# Patient Record
Sex: Male | Born: 1980 | Race: Black or African American | Hispanic: No | Marital: Married | Smoking: Former smoker
Health system: Southern US, Community
[De-identification: ages and names within clinical notes are randomized; demographics above are authoritative.]

## PROBLEM LIST (undated history)

## (undated) DIAGNOSIS — K219 Gastro-esophageal reflux disease without esophagitis: Secondary | ICD-10-CM

## (undated) DIAGNOSIS — I499 Cardiac arrhythmia, unspecified: Secondary | ICD-10-CM

## (undated) DIAGNOSIS — J449 Chronic obstructive pulmonary disease, unspecified: Secondary | ICD-10-CM

## (undated) DIAGNOSIS — I4891 Unspecified atrial fibrillation: Secondary | ICD-10-CM

## (undated) HISTORY — PX: ORIF FINGER / THUMB FRACTURE: SUR932

## (undated) HISTORY — DX: Gastro-esophageal reflux disease without esophagitis: K21.9

## (undated) HISTORY — DX: Unspecified atrial fibrillation: I48.91

---

## 2016-02-17 ENCOUNTER — Emergency Department: Payer: Self-pay

## 2016-02-17 ENCOUNTER — Encounter: Payer: Self-pay | Admitting: *Deleted

## 2016-02-17 ENCOUNTER — Emergency Department
Admission: EM | Admit: 2016-02-17 | Discharge: 2016-02-17 | Disposition: A | Discharge status: Home / Self Care | Payer: Self-pay | Attending: Emergency Medicine | Admitting: Emergency Medicine

## 2016-02-17 DIAGNOSIS — R1031 Right lower quadrant pain: Secondary | ICD-10-CM | POA: Insufficient documentation

## 2016-02-17 DIAGNOSIS — F1290 Cannabis use, unspecified, uncomplicated: Secondary | ICD-10-CM | POA: Insufficient documentation

## 2016-02-17 DIAGNOSIS — F17200 Nicotine dependence, unspecified, uncomplicated: Secondary | ICD-10-CM | POA: Insufficient documentation

## 2016-02-17 DIAGNOSIS — R109 Unspecified abdominal pain: Secondary | ICD-10-CM

## 2016-02-17 LAB — COMPREHENSIVE METABOLIC PANEL
ALK PHOS: 50 U/L (ref 38–126)
ALT: 23 U/L (ref 17–63)
AST: 18 U/L (ref 15–41)
Albumin: 4.1 g/dL (ref 3.5–5.0)
Anion gap: 4 — ABNORMAL LOW (ref 5–15)
BUN: 14 mg/dL (ref 6–20)
CALCIUM: 8.7 mg/dL — AB (ref 8.9–10.3)
CHLORIDE: 107 mmol/L (ref 101–111)
CO2: 28 mmol/L (ref 22–32)
CREATININE: 1.04 mg/dL (ref 0.61–1.24)
Glucose, Bld: 97 mg/dL (ref 65–99)
Potassium: 3.9 mmol/L (ref 3.5–5.1)
Sodium: 139 mmol/L (ref 135–145)
Total Bilirubin: 0.6 mg/dL (ref 0.3–1.2)
Total Protein: 7.2 g/dL (ref 6.5–8.1)

## 2016-02-17 LAB — URINALYSIS COMPLETE WITH MICROSCOPIC (ARMC ONLY)
Bacteria, UA: NONE SEEN
Bilirubin Urine: NEGATIVE
Glucose, UA: NEGATIVE mg/dL
KETONES UR: NEGATIVE mg/dL
LEUKOCYTES UA: NEGATIVE
Nitrite: NEGATIVE
PH: 5 (ref 5.0–8.0)
PROTEIN: NEGATIVE mg/dL
SPECIFIC GRAVITY, URINE: 1.023 (ref 1.005–1.030)

## 2016-02-17 LAB — CBC
HCT: 44.6 % (ref 40.0–52.0)
Hemoglobin: 15.5 g/dL (ref 13.0–18.0)
MCH: 32.1 pg (ref 26.0–34.0)
MCHC: 34.8 g/dL (ref 32.0–36.0)
MCV: 92.4 fL (ref 80.0–100.0)
PLATELETS: 187 10*3/uL (ref 150–440)
RBC: 4.83 MIL/uL (ref 4.40–5.90)
RDW: 13.2 % (ref 11.5–14.5)
WBC: 6.9 10*3/uL (ref 3.8–10.6)

## 2016-02-17 LAB — LIPASE, BLOOD: LIPASE: 26 U/L (ref 11–51)

## 2016-02-17 MED ORDER — ONDANSETRON HCL 4 MG/2ML IJ SOLN
4.0000 mg | Freq: Once | INTRAMUSCULAR | Status: AC
  Administered 2016-02-17: 4 mg via INTRAVENOUS
  Filled 2016-02-17: qty 2

## 2016-02-17 MED ORDER — SODIUM CHLORIDE 0.9 % IV BOLUS (SEPSIS)
1000.0000 mL | Freq: Once | INTRAVENOUS | Status: AC
  Administered 2016-02-17: 1000 mL via INTRAVENOUS

## 2016-02-17 MED ORDER — KETOROLAC TROMETHAMINE 30 MG/ML IJ SOLN
30.0000 mg | Freq: Once | INTRAMUSCULAR | Status: AC
  Administered 2016-02-17: 30 mg via INTRAVENOUS
  Filled 2016-02-17: qty 1

## 2016-02-17 MED ORDER — MORPHINE SULFATE (PF) 4 MG/ML IV SOLN
4.0000 mg | Freq: Once | INTRAVENOUS | Status: AC
  Administered 2016-02-17: 4 mg via INTRAVENOUS
  Filled 2016-02-17: qty 1

## 2016-02-17 NOTE — ED Triage Notes (Signed)
Pt presents w/ c/o RLQ abdominal pain that started yesterday. Pt states 1 yr ago he had similar pain for which he was not treated and it resolved at that time. Pt denies urinary sxs, c/o diarrhea. Pt states loss of appetite on Wed but was able to eat today. Pt denies n/v at this time, c/o diarrhea.

## 2016-02-17 NOTE — ED Provider Notes (Signed)
[facility] Emergency Department Provider Note  ____________________________________________  Time seen: Approximately 4:05 AM  I have reviewed the triage vital signs and the nursing notes.   HISTORY  Chief Complaint Abdominal Pain   HPI Daniel Ruiz is a 35 y.o. male no significant past medical history who presents for evaluation of right flank pain. Patient reports that the pain started at 11 PM this evening. The pain is sharp, 8 out of 10, located in his right flank, radiated to his right groin, constant. He hasn't tried anything for the pain. He denies dysuria or hematuria, nausea or vomiting, fever, chest pain, shortness of breath. Patient reports that he thinks he had a prior kidney stone but is not sure. No prior abdominal surgeries.  History reviewed. No pertinent past medical history.  There are no active problems to display for this patient.   Past Surgical History:  Procedure Laterality Date  . ORIF FINGER / THUMB FRACTURE Right     Prior to Admission medications   Not on File    Allergies Review of patient's allergies indicates no known allergies.  History reviewed. No pertinent family history.  Social History Social History  Substance Use Topics  . Smoking status: Current Every Day Smoker  . Smokeless tobacco: Never Used  . Alcohol use Yes    Review of Systems  Constitutional: Negative for fever. Eyes: Negative for visual changes. ENT: Negative for sore throat. Cardiovascular: Negative for chest pain. Respiratory: Negative for shortness of breath. Gastrointestinal: + RLQ abdominal pain. No vomiting or diarrhea. Genitourinary: Negative for dysuria. + R flank pain Musculoskeletal: Negative for back pain. Skin: Negative for rash. Neurological: Negative for headaches, weakness or numbness.  ____________________________________________   PHYSICAL EXAM:  VITAL SIGNS: ED Triage Vitals  Enc Vitals Group     BP  02/17/16 0256 (!) 142/87     Pulse Rate 02/17/16 0256 82     Resp 02/17/16 0256 18     Temp 02/17/16 0256 98.2 F (36.8 C)     Temp Source 02/17/16 0256 Oral     SpO2 02/17/16 0256 99 %     Weight 02/17/16 0257 220 lb (99.8 kg)     Height 02/17/16 0257  (1.905 m)     Head Circumference --      Peak Flow --      Pain Score 02/17/16 0257 2     Pain Loc --      Pain Edu? --      Excl. in GC? --     Constitutional: Alert and oriented. Well appearing and in no apparent distress. HEENT:      Head: Normocephalic and atraumatic.         Eyes: Conjunctivae are normal. Sclera is non-icteric. EOMI. PERRL      Mouth/Throat: Mucous membranes are moist.       Neck: Supple with no signs of meningismus. Cardiovascular: Regular rate and rhythm. No murmurs, gallops, or rubs. 2+ symmetrical distal pulses are present in all extremities. No JVD. Respiratory: Normal respiratory effort. Lungs are clear to auscultation bilaterally. No wheezes, crackles, or rhonchi.  Gastrointestinal: Soft, mild ttp over the RLQ, and non distended with positive bowel sounds. No rebound or guarding. Genitourinary: No CVA tenderness. Bilateral descended testes, no erythema or swelling of his scrotum, no evidence of inguinal hernia, positive cremasteric reflex Musculoskeletal: Nontender with normal range of motion in all extremities. No edema, cyanosis, or erythema of extremities. Neurologic: Normal speech and language. Face is  symmetric. Moving all extremities. No gross focal neurologic deficits are appreciated. Skin: Skin is warm, dry and intact. No rash noted. Psychiatric: Mood and affect are normal. Speech and behavior are normal.  ____________________________________________   LABS (all labs ordered are listed, but only abnormal results are displayed)  Labs Reviewed  COMPREHENSIVE METABOLIC PANEL - Abnormal; Notable for the following:       Result Value   Calcium 8.7 (*)    Anion gap 4 (*)    All other  components within normal limits  URINALYSIS COMPLETEWITH MICROSCOPIC ([facility] ONLY) - Abnormal; Notable for the following:    Color, Urine YELLOW (*)    APPearance CLEAR (*)    Hgb urine dipstick 1+ (*)    Squamous Epithelial / LPF 0-5 (*)    All other components within normal limits  LIPASE, BLOOD  CBC   ____________________________________________  EKG   None ____________________________________________  RADIOLOGY  CT renal:  Negative ____________________________________________   PROCEDURES  Procedure(s) performed: None Procedures Critical Care performed:  None ____________________________________________   INITIAL IMPRESSION / ASSESSMENT AND PLAN / ED COURSE   35 y.o. male no significant past medical history who presents for evaluation of [id number] onset right flank pain radiating to the RLQ. On exam as well appearing, no distress, vital signs within normal limits, he has mild tenderness to palpation in the right lower quadrant, GU exams within normal limits, no flank tenderness. Labs showing normal white count, normal kidney function, UA with 1+ blood but no evidence of infection. Presentation concerning for kidney stone. We'll treat symptoms with IV fluids, IV Toradol, IV morphine, IV Zofran. We'll get a CT renal protocol.  Clinical Course  Comment By Time  CT showing normal appendix and no evidence of kidney stone. Patient is pain-free and reports that he feels very hungry him like to go home. We'll discharge him home with [facility].   I discussed my evaluation of the patient's symptoms, my clinical impression, and my proposed outpatient treatment plan with patient/ family members. We have discussed anticipatory guidance, scheduled follow-up, and careful return precautions. The patient expresses understanding and is comfortable with the discharge plan. All patient's questions were answered.  Nita Sicklearolina Abdulkarim Eberlin, MD 08/11 [phone]    Pertinent  labs & imaging results that were available during my care of the patient were reviewed by me and considered in my medical decision making (see chart for details).    ____________________________________________   FINAL CLINICAL IMPRESSION(S) / ED DIAGNOSES  Final diagnoses:  Abdominal pain, unspecified abdominal location      NEW MEDICATIONS STARTED DURING THIS VISIT:  New Prescriptions   No medications on file     Note:  This document was prepared using Dragon voice recognition software and may include unintentional dictation errors.    Nita Sicklearolina Kees Idrovo, MD 02/17/16 [phone]

## 2016-02-17 NOTE — ED Notes (Signed)
Pt reports right side pain since this afternoon - pt reports he has had this pain before and passed a kidney stone - denies pain with urination - denies frequency with urination

## 2016-02-17 NOTE — Discharge Instructions (Signed)

## 2016-03-09 ENCOUNTER — Emergency Department
Admission: EM | Admit: 2016-03-09 | Discharge: 2016-03-09 | Disposition: A | Discharge status: Home / Self Care | Payer: Self-pay | Attending: Emergency Medicine | Admitting: Emergency Medicine

## 2016-03-09 DIAGNOSIS — R197 Diarrhea, unspecified: Secondary | ICD-10-CM | POA: Insufficient documentation

## 2016-03-09 DIAGNOSIS — F17210 Nicotine dependence, cigarettes, uncomplicated: Secondary | ICD-10-CM | POA: Insufficient documentation

## 2016-03-09 DIAGNOSIS — J069 Acute upper respiratory infection, unspecified: Secondary | ICD-10-CM | POA: Insufficient documentation

## 2016-03-09 MED ORDER — PROMETHAZINE-DM 6.25-15 MG/5ML PO SYRP: ORAL_SOLUTION | ORAL | 0 refills | Status: DC

## 2016-03-09 NOTE — ED Triage Notes (Signed)
Pt c/o cough with sinus and chest congestion for the past 2 days.

## 2016-03-09 NOTE — Discharge Instructions (Signed)
Discontinue smoking. Increase fluids. Tylenol or ibuprofen as needed for throat pain or fever or aches. Phenergan DM as needed for cough and congestion. Follow-up with your primary care doctor or [facility] if any continued problems.

## 2016-03-09 NOTE — ED Provider Notes (Signed)
[facility] Emergency Department Provider Note   ____________________________________________   First MD Initiated Contact with Patient 03/09/16 [phone]     (approximate)  I have reviewed the triage vital signs and the nursing notes.   HISTORY  Chief Complaint URI   HPI Daniel Ruiz is a 35 y.o. male here with complaint of nonproductive cough and chest congestion for 2 days. Patient states that once during the today. He took a over-the-counter sinus medicine that belonged to a friend one time. Patient is unaware of any fever or chills but states that he woke up sweating this morning but had "covers" on top of him. He has not taken any other medication for this. He denies any throat pain, ear pain, nausea or vomiting. Patient states he has had minimal diarrhea which he adds is not unusual for him. Patient continues to smoke one half pack of cigarettes per day. He also admits to smoking marijuana 3 times a week. Currently he rates his discomfort as 6 out of 10.   History reviewed. No pertinent past medical history.  There are no active problems to display for this patient.   Past Surgical History:  Procedure Laterality Date  . ORIF FINGER / THUMB FRACTURE Right     Prior to Admission medications   Medication Sig Start Date End Date Taking? Authorizing Provider  promethazine-dextromethorphan (PROMETHAZINE-DM) 6.25-15 MG/5ML syrup 1 tsp qid for cough and congestion 03/09/16   Tommi Rumps, PA-C    Allergies Review of patient's allergies indicates no known allergies.  No family history on file.  Social History Social History  Substance Use Topics  . Smoking status: Current Every Day Smoker  . Smokeless tobacco: Never Used  . Alcohol use Yes    Review of Systems Constitutional: No fever/chills Eyes: No visual changes. ENT: No sore throat.Denies ear pain. Cardiovascular: Denies chest pain. Respiratory: Denies shortness of breath.  Nonproductive congested cough. Gastrointestinal: No abdominal pain.  No nausea, no vomiting.  Occasional diarrhea.  No constipation. Genitourinary: Negative for dysuria. Musculoskeletal: Negative for back pain. Skin: Negative for rash. Neurological: Negative for headaches, focal weakness or numbness.  10-point ROS otherwise negative.  ____________________________________________   PHYSICAL EXAM:  VITAL SIGNS: ED Triage Vitals  Enc Vitals Group     BP 03/09/16 0803 (!) 129/92     Pulse Rate 03/09/16 0803 (!) 103     Resp 03/09/16 0803 18     Temp 03/09/16 0803 98 F (36.7 C)     Temp Source 03/09/16 0803 Oral     SpO2 --      Weight 03/09/16 0759 220 lb (99.8 kg)     Height 03/09/16 0759 6\' 3"  (1.905 m)     Head Circumference --      Peak Flow --      Pain Score 03/09/16 0800 6     Pain Loc --      Pain Edu? --      Excl. in GC? --     Constitutional: Alert and oriented. Well appearing and in no acute distress. Eyes: Conjunctivae are normal. PERRL. EOMI. Head: Atraumatic. Nose: Minimal congestion/rhinnorhea.  EACs are clear bilaterally. TMs are dull bilaterally. No erythema or injection seen. Mouth/Throat: Mucous membranes are moist.  Oropharynx non-erythematous. Neck: No stridor.   Hematological/Lymphatic/Immunilogical: No cervical lymphadenopathy. Cardiovascular: Normal rate, regular rhythm. Grossly normal heart sounds.  Good peripheral circulation. Respiratory: Normal respiratory effort.  No retractions. Lungs CTAB. Rare cough present Musculoskeletal: No lower extremity tenderness  nor edema.  No joint effusions. Neurologic:  Normal speech and language. No gross focal neurologic deficits are appreciated. No gait instability. Skin:  Skin is warm, dry and intact. No rash noted. Psychiatric: Mood and affect are normal. Speech and behavior are normal.  ____________________________________________   LABS (all labs ordered are listed, but only abnormal results are  displayed)  Labs Reviewed - No data to display  PROCEDURES  Procedure(s) performed: None  Procedures  Critical Care performed: No  ____________________________________________   INITIAL IMPRESSION / ASSESSMENT AND PLAN / ED COURSE  Pertinent labs & imaging results that were available during my care of the patient were reviewed by me and considered in my medical decision making (see chart for details).    Clinical Course   Patient was given a prescription for Phenergan DM for cough and congestion. Patient was encouraged to discontinue smoking. He is to increase fluids and also take Tylenol or ibuprofen if needed for fever or aches.  ____________________________________________   FINAL CLINICAL IMPRESSION(S) / ED DIAGNOSES  Final diagnoses:  URI (upper respiratory infection)      NEW MEDICATIONS STARTED DURING THIS VISIT:  Discharge Medication List as of 03/09/2016  8:52 AM    START taking these medications   Details  promethazine-dextromethorphan (PROMETHAZINE-DM) 6.25-15 MG/5ML syrup 1 tsp qid for cough and congestion, Print         Note:  This document was prepared using Dragon voice recognition software and may include unintentional dictation errors.    Tommi Rumpshonda L Terrall Bley, PA-C 03/09/16 1023    Emily FilbertJonathan E Williams, MD 03/09/16 1124

## 2016-03-21 ENCOUNTER — Emergency Department
Admission: EM | Admit: 2016-03-21 | Discharge: 2016-03-21 | Disposition: A | Discharge status: Home / Self Care | Payer: Self-pay | Attending: Emergency Medicine | Admitting: Emergency Medicine

## 2016-03-21 ENCOUNTER — Encounter: Payer: Self-pay | Admitting: Emergency Medicine

## 2016-03-21 ENCOUNTER — Emergency Department: Payer: Self-pay

## 2016-03-21 DIAGNOSIS — S46912A Strain of unspecified muscle, fascia and tendon at shoulder and upper arm level, left arm, initial encounter: Secondary | ICD-10-CM | POA: Insufficient documentation

## 2016-03-21 DIAGNOSIS — F17200 Nicotine dependence, unspecified, uncomplicated: Secondary | ICD-10-CM | POA: Insufficient documentation

## 2016-03-21 DIAGNOSIS — X58XXXA Exposure to other specified factors, initial encounter: Secondary | ICD-10-CM | POA: Insufficient documentation

## 2016-03-21 DIAGNOSIS — M25512 Pain in left shoulder: Secondary | ICD-10-CM

## 2016-03-21 DIAGNOSIS — Y999 Unspecified external cause status: Secondary | ICD-10-CM | POA: Insufficient documentation

## 2016-03-21 DIAGNOSIS — Y939 Activity, unspecified: Secondary | ICD-10-CM | POA: Insufficient documentation

## 2016-03-21 DIAGNOSIS — Y929 Unspecified place or not applicable: Secondary | ICD-10-CM | POA: Insufficient documentation

## 2016-03-21 MED ORDER — METHOCARBAMOL 750 MG PO TABS: 750.0000 mg | ORAL_TABLET | Freq: Four times a day (QID) | ORAL | 0 refills | Status: DC

## 2016-03-21 MED ORDER — NAPROXEN 500 MG PO TABS: 500.0000 mg | ORAL_TABLET | Freq: Two times a day (BID) | ORAL | 0 refills | Status: DC

## 2016-03-21 MED ORDER — PREDNISONE 10 MG PO TABS: 50.0000 mg | ORAL_TABLET | Freq: Every day | ORAL | 0 refills | Status: DC

## 2016-03-21 NOTE — ED Provider Notes (Signed)
[facility] Emergency Department Provider Note  ____________________________________________  Time seen: Approximately 8:53 AM  I have reviewed the triage vital signs and the nursing notes.   HISTORY  Chief Complaint Shoulder Pain and work note    HPI Daniel Ruiz is a 35 y.o. male who presents for evaluation of left shoulder pain for 1 month. Patient reports with range of motion hurts worse when raising his arm over his head. Denies any known trauma. Patient states his pain is 7/10 nonradiating.  History reviewed. No pertinent past medical history.  There are no active problems to display for this patient.   Past Surgical History:  Procedure Laterality Date  . ORIF FINGER / THUMB FRACTURE Right     Prior to Admission medications   Medication Sig Start Date End Date Taking? Authorizing Provider  methocarbamol (ROBAXIN) 750 MG tablet Take 1 tablet (750 mg total) by mouth 4 (four) times daily. 03/21/16   Evangeline Dakin, PA-C  naproxen (NAPROSYN) 500 MG tablet Take 1 tablet (500 mg total) by mouth 2 (two) times daily with a meal. 03/21/16   Evangeline Dakin, PA-C  predniSONE (DELTASONE) 10 MG tablet Take 5 tablets (50 mg total) by mouth daily with breakfast. 03/21/16   Evangeline Dakin, PA-C    Allergies Review of patient's allergies indicates no known allergies.  No family history on file.  Social History Social History  Substance Use Topics  . Smoking status: Current Every Day Smoker  . Smokeless tobacco: Never Used  . Alcohol use Yes    Review of Systems Constitutional: No fever/chills Eyes: No visual changes. ENT: No sore throat. Cardiovascular: Denies chest pain. Respiratory: Denies shortness of breath. Gastrointestinal: No abdominal pain.  No nausea, no vomiting.  No diarrhea.  No constipation. Genitourinary: Negative for dysuria. Musculoskeletal: Negative for back pain. Skin: Negative for rash. Neurological: Negative for headaches,  focal weakness or numbness.  10-point ROS otherwise negative.  ____________________________________________   PHYSICAL EXAM:  VITAL SIGNS: ED Triage Vitals  Enc Vitals Group     BP 03/21/16 0849 (!) 152/71     Pulse Rate 03/21/16 0849 75     Resp 03/21/16 0849 17     Temp 03/21/16 0849 97.7 F (36.5 C)     Temp Source 03/21/16 0849 Oral     SpO2 03/21/16 0849 98 %     Weight 03/21/16 0850 220 lb (99.8 kg)     Height 03/21/16 0850 6\' 3"  (1.905 m)     Head Circumference --      Peak Flow --      Pain Score 03/21/16 0849 7     Pain Loc --      Pain Edu? --      Excl. in GC? --     Constitutional: Alert and oriented. Well appearing and in no acute distress. Neck: No stridor. Supple, full range of motion.  Cardiovascular: Normal rate, regular rhythm. Grossly normal heart sounds.  Good peripheral circulation. Respiratory: Normal respiratory effort.  No retractions. Lungs CTAB. Musculoskeletal: Left shoulder with limited range of motion increased pain with extension and abduction and adduction. Neurologic:  Normal speech and language. No gross focal neurologic deficits are appreciated. No gait instability. Skin:  Skin is warm, dry and intact. No rash noted. Psychiatric: Mood and affect are normal. Speech and behavior are normal.  ____________________________________________   LABS (all labs ordered are listed, but only abnormal results are displayed)  Labs Reviewed - No data to display ____________________________________________  EKG   ____________________________________________  RADIOLOGY  No acute osseous findings noted. ____________________________________________   PROCEDURES  Procedure(s) performed: None  Critical Care performed: No  ____________________________________________   INITIAL IMPRESSION / ASSESSMENT AND PLAN / ED COURSE  Pertinent labs & imaging results that were available during my care of the patient were reviewed by me and considered  in my medical decision making (see chart for details). Review of the [place] CSRS was performed in accordance of the [facility] prior to dispensing any controlled drugs.  Acute left shoulder strain. Rx given for prednisone, Naprosyn and Robaxin. Patient placed in a sling immobilizer for comfort. Patient follow-up with orthopedics with PCP as needed.  Clinical Course    ____________________________________________   FINAL CLINICAL IMPRESSION(S) / ED DIAGNOSES  Final diagnoses:  Shoulder pain, acute, left  Shoulder strain, left, initial encounter     This chart was dictated using voice recognition software/Dragon. Despite best efforts to proofread, errors can occur which can change the meaning. Any change was purely unintentional.    Evangeline Dakinharles M Andy Moye, PA-C 03/21/16 1156    Minna AntisKevin Paduchowski, MD 03/21/16 [phone]

## 2016-03-21 NOTE — Discharge Instructions (Signed)
Sling as needed for comfort

## 2016-03-21 NOTE — ED Notes (Signed)
Developed pain to left shoulder about 1 month ago  Now having some numbness to same arm  This started a few days ago  denies any injury   No deformity  Pain increases with movement

## 2016-03-21 NOTE — ED Triage Notes (Signed)
Pt presents with left shoulder pain for one mth. Pt reports LROM and hurts worse to raise arm over head.

## 2016-12-02 IMAGING — CR DG SHOULDER 2+V*L*
1 series · 3 of 3 positions shown · non-contrast
Comparison: None in PACs

CLINICAL DATA: Left shoulder pain began 1 month ago. Patient is now
experiencing some numbness in the left arm which began several days
ago. No known injury. Pain is made worse with movement.

EXAM:
LEFT SHOULDER - 2+ VIEW

[Series 1: dg shoulder left · 0.14mm/px · 3 of 3 slices shown]
[im 1/3]
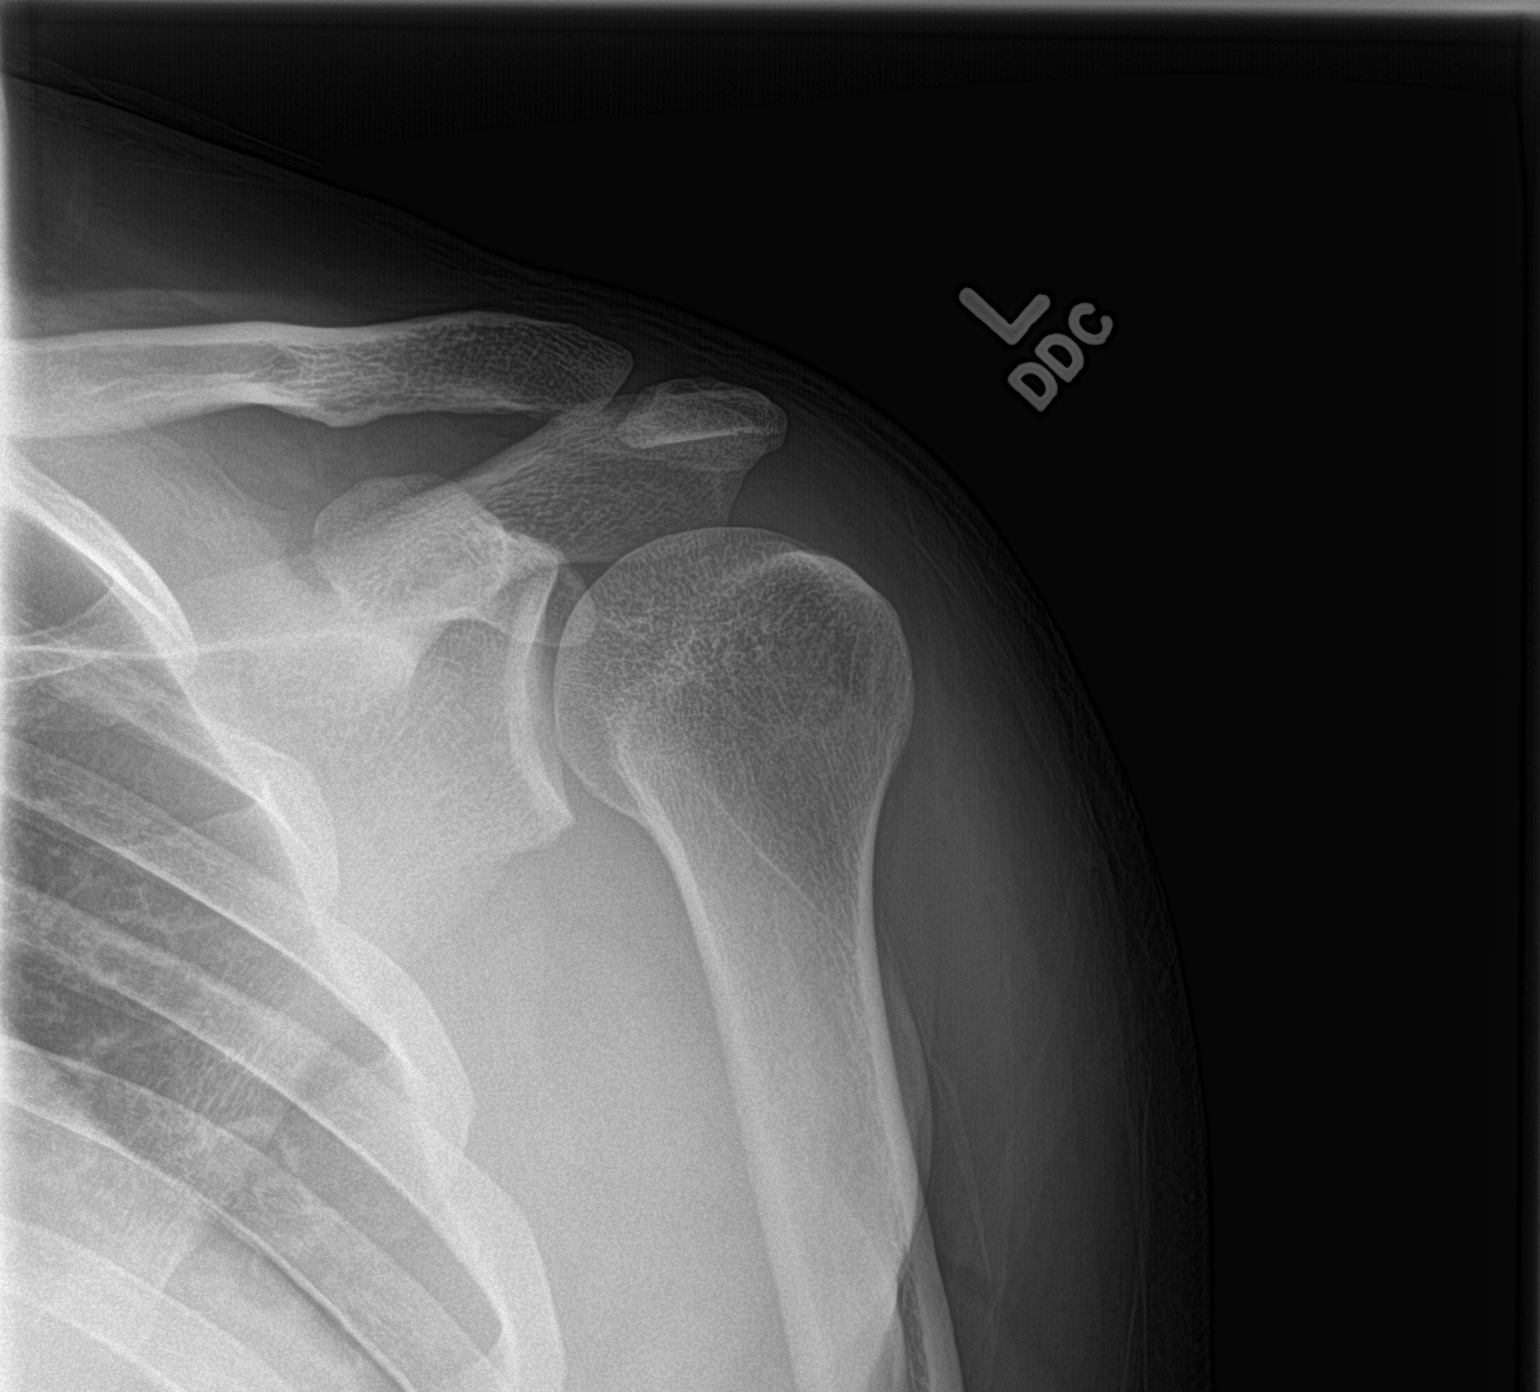
[im 2/3]
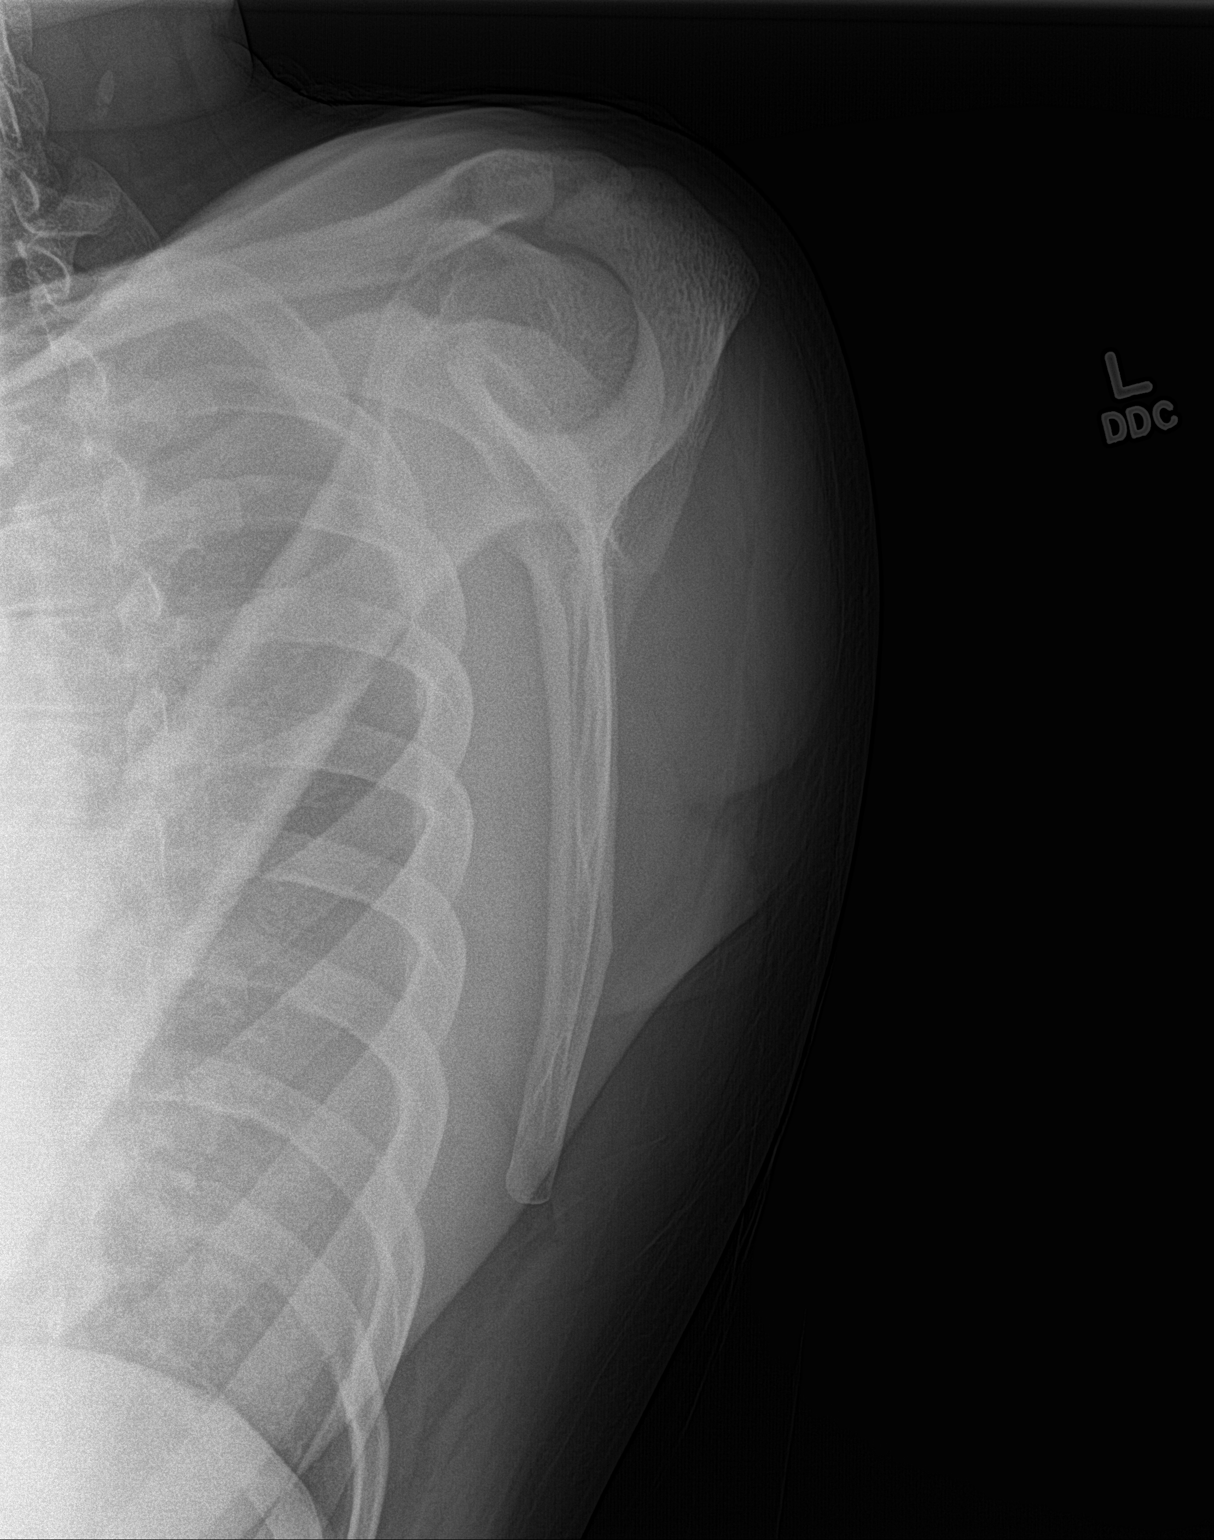
[im 3/3]
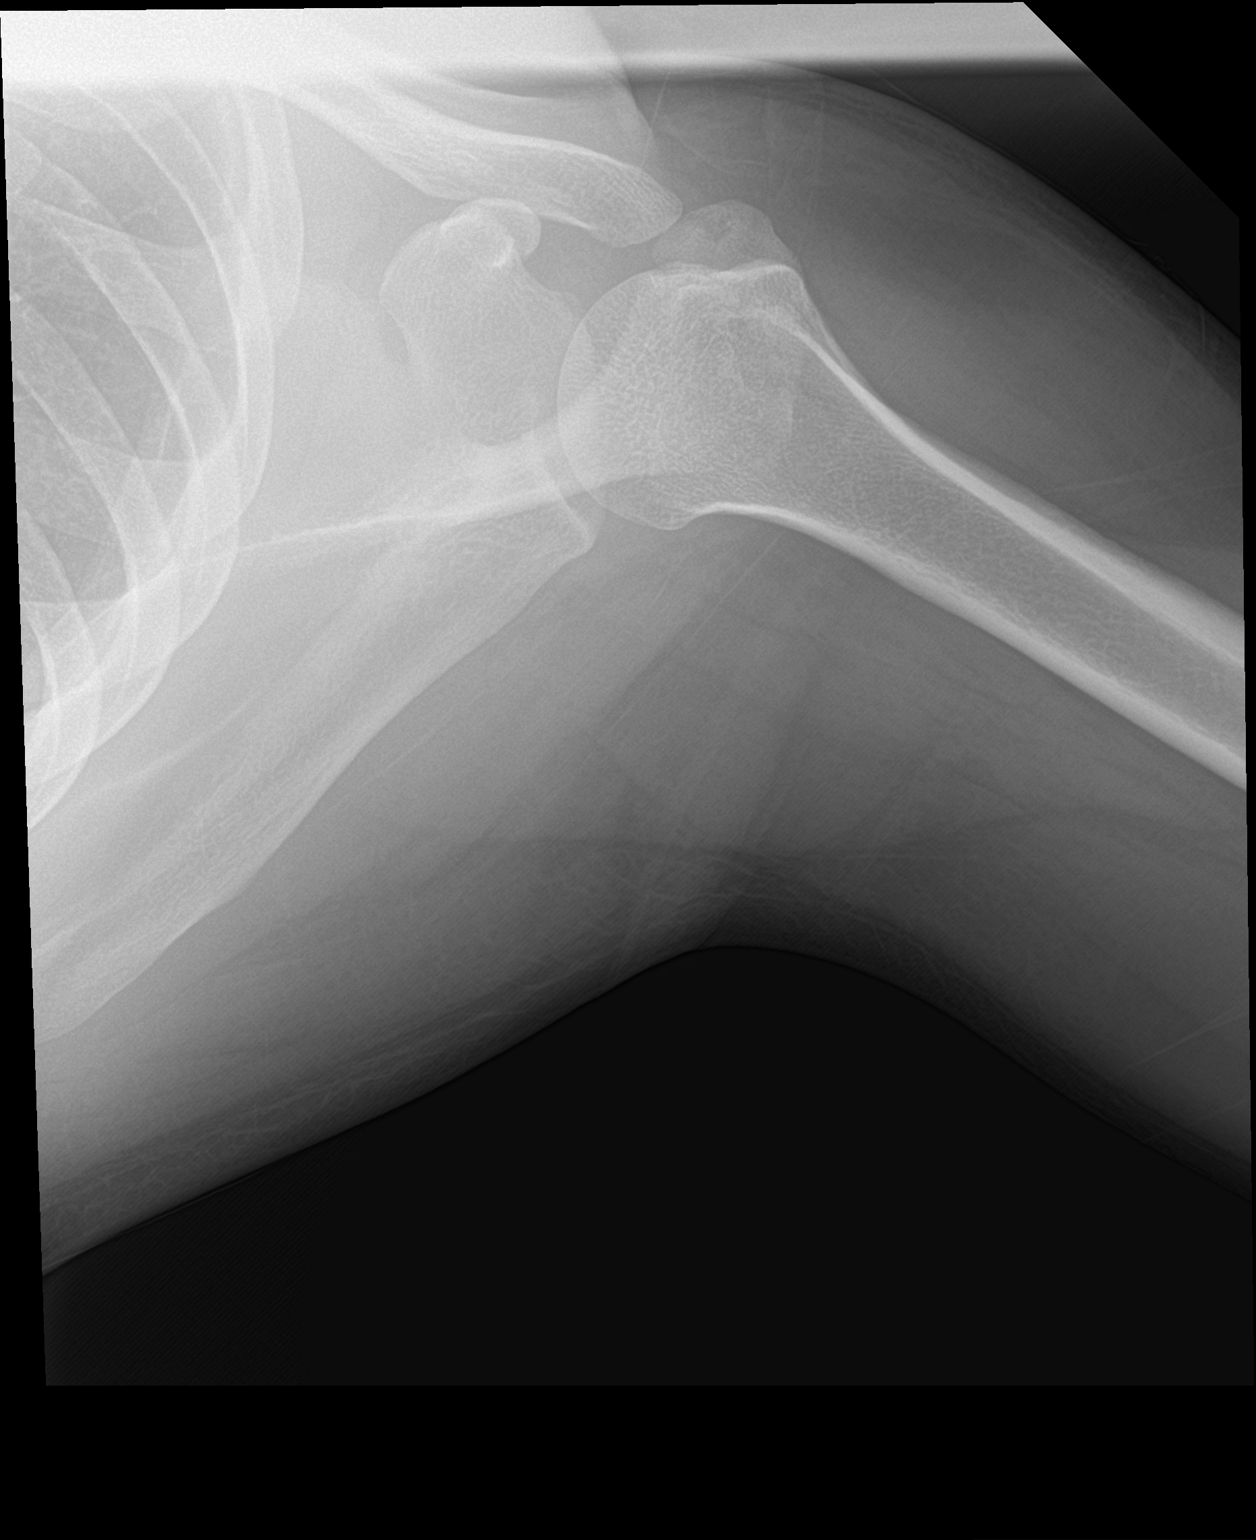

[3 of 3 positions shown; findings below may reference images not displayed]

FINDINGS: The bones are subjectively adequately mineralized. The glenohumeral
and AC joints are unremarkable. The observed portions of the left
clavicle and upper left ribs are normal.
IMPRESSION: There is no acute or significant chronic bony abnormality of the
left shoulder.

## 2017-01-07 ENCOUNTER — Encounter: Payer: Self-pay | Admitting: Emergency Medicine

## 2017-01-07 ENCOUNTER — Emergency Department
Admission: EM | Admit: 2017-01-07 | Discharge: 2017-01-07 | Disposition: A | Discharge status: Home / Self Care | Payer: Self-pay | Attending: Emergency Medicine | Admitting: Emergency Medicine

## 2017-01-07 DIAGNOSIS — K529 Noninfective gastroenteritis and colitis, unspecified: Secondary | ICD-10-CM

## 2017-01-07 DIAGNOSIS — F17200 Nicotine dependence, unspecified, uncomplicated: Secondary | ICD-10-CM | POA: Insufficient documentation

## 2017-01-07 DIAGNOSIS — K5289 Other specified noninfective gastroenteritis and colitis: Secondary | ICD-10-CM | POA: Insufficient documentation

## 2017-01-07 LAB — COMPREHENSIVE METABOLIC PANEL
ALBUMIN: 4 g/dL (ref 3.5–5.0)
ALK PHOS: 43 U/L (ref 38–126)
ALT: 36 U/L (ref 17–63)
AST: 29 U/L (ref 15–41)
Anion gap: 7 (ref 5–15)
BILIRUBIN TOTAL: 0.8 mg/dL (ref 0.3–1.2)
BUN: 15 mg/dL (ref 6–20)
CALCIUM: 8.9 mg/dL (ref 8.9–10.3)
CO2: 24 mmol/L (ref 22–32)
CREATININE: 1.14 mg/dL (ref 0.61–1.24)
Chloride: 108 mmol/L (ref 101–111)
GFR calc Af Amer: >60 mL/min (ref >60)
GFR calc non Af Amer: >60 mL/min (ref >60)
Glucose, Bld: 105 mg/dL — ABNORMAL HIGH (ref 65–99)
Potassium: 3.7 mmol/L (ref 3.5–5.1)
SODIUM: 139 mmol/L (ref 135–145)
Total Protein: 7.1 g/dL (ref 6.5–8.1)

## 2017-01-07 LAB — CBC
HCT: 46 % (ref 40.0–52.0)
Hemoglobin: 15.5 g/dL (ref 13.0–18.0)
MCH: 30.6 pg (ref 26.0–34.0)
MCHC: 33.8 g/dL (ref 32.0–36.0)
MCV: 90.7 fL (ref 80.0–100.0)
PLATELETS: 208 10*3/uL (ref 150–440)
RBC: 5.07 MIL/uL (ref 4.40–5.90)
RDW: 13.3 % (ref 11.5–14.5)
WBC: 5.3 10*3/uL (ref 3.8–10.6)

## 2017-01-07 LAB — LIPASE, BLOOD: Lipase: 31 U/L (ref 11–51)

## 2017-01-07 MED ORDER — ONDANSETRON 4 MG PO TBDP: 4.0000 mg | ORAL_TABLET | Freq: Three times a day (TID) | ORAL | 0 refills | Status: DC | PRN

## 2017-01-07 MED ORDER — ONDANSETRON HCL 4 MG/2ML IJ SOLN
4.0000 mg | Freq: Once | INTRAMUSCULAR | Status: AC | PRN
  Administered 2017-01-07: 4 mg via INTRAVENOUS
  Filled 2017-01-07: qty 2

## 2017-01-07 NOTE — ED Provider Notes (Signed)
[facility] Emergency Department Provider Note       Time seen: ----------------------------------------- 2:13 PM on 01/07/2017 -----------------------------------------     I have reviewed the triage vital signs and the nursing notes.   HISTORY   Chief Complaint Emesis and Diarrhea    HPI Daniel Ruiz is a 36 y.o. male who presents to the ED for vomiting and diarrhea that started this morning was sharp severe abdominal pain. Prior to my evaluation he has received a liter of fluids and antiemetics and currently feels better. Patient reports he vomited around 12 times morning and had diarrhea around 5 times today. He has not had a history of this before, nothing made it better or worse earlier.   History reviewed. No pertinent past medical history.  There are no active problems to display for this patient.   Past Surgical History:  Procedure Laterality Date  . ORIF FINGER / THUMB FRACTURE Right     Allergies Patient has no known allergies.  Social History Social History  Substance Use Topics  . Smoking status: Current Every Day Smoker  . Smokeless tobacco: Never Used  . Alcohol use Yes    Review of Systems Constitutional: Negative for fever. Cardiovascular: Negative for chest pain. Respiratory: Negative for shortness of breath. Gastrointestinal: Positive for abdominal pain, vomiting and diarrhea Genitourinary: Negative for dysuria. Musculoskeletal: Negative for back pain. Skin: Negative for rash. Neurological: Negative for headaches, focal weakness or numbness.  All systems negative/normal/unremarkable except as stated in the HPI  ____________________________________________   PHYSICAL EXAM:  VITAL SIGNS: ED Triage Vitals [01/07/17 1108]  Enc Vitals Group     BP 138/85     Pulse Rate 66     Resp      Temp 97.9 F (36.6 C)     Temp Source Oral     SpO2      Weight 220 lb (99.8 kg)     Height 6\' 3"  (1.905 m)     Head  Circumference      Peak Flow      Pain Score 10     Pain Loc      Pain Edu?      Excl. in GC?    Constitutional: Alert and oriented. Well appearing and in no distress. Eyes: Conjunctivae are normal. Normal extraocular movements. ENT   Head: Normocephalic and atraumatic.   Nose: No congestion/rhinnorhea.   Mouth/Throat: Mucous membranes are moist.   Neck: No stridor. Cardiovascular: Normal rate, regular rhythm. No murmurs, rubs, or gallops. Respiratory: Normal respiratory effort without tachypnea nor retractions. Breath sounds are clear and equal bilaterally. No wheezes/rales/rhonchi. Gastrointestinal: Soft and nontender. Normal bowel sounds Musculoskeletal: Nontender with normal range of motion in extremities. No lower extremity tenderness nor edema. Neurologic:  Normal speech and language. No gross focal neurologic deficits are appreciated.  Skin:  Skin is warm, dry and intact. No rash noted. Psychiatric: Mood and affect are normal. Speech and behavior are normal.  ___________________________________________  ED COURSE:  Pertinent labs & imaging results that were available during my care of the patient were reviewed by me and considered in my medical decision making (see chart for details). Patient presents for symptoms of gastroenteritis, we will assess with labs as indicated   Procedures ____________________________________________   LABS (pertinent positives/negatives)  Labs Reviewed  COMPREHENSIVE METABOLIC PANEL - Abnormal; Notable for the following:       Result Value   Glucose, Bld 105 (*)    All other components within normal  limits  LIPASE, BLOOD  CBC  URINALYSIS, COMPLETE (UACMP) WITH MICROSCOPIC   ____________________________________________  FINAL ASSESSMENT AND PLAN  Gastroenteritis  Plan: Patient's labs were dictated above. Patient had presented for vomiting and diarrhea which is now improved. Have advised a gentle diet progression, I will  prescribe antiemetics and he is stable for outpatient follow-up.   Emily Filbert, MD   Note: This note was generated in part or whole with voice recognition software. Voice recognition is usually quite accurate but there are transcription errors that can and very often do occur. I apologize for any typographical errors that were not detected and corrected.     Emily Filbert, MD 01/07/17 1415

## 2017-01-07 NOTE — ED Triage Notes (Signed)
Pt to ed with c/o vomiting and diarrhea that started this am with sharp severe abd pain.  Pt states he has vomited x 12 this am, also reports diarrhea x 5 today.

## 2018-02-20 ENCOUNTER — Encounter: Payer: Self-pay | Admitting: Emergency Medicine

## 2018-02-20 ENCOUNTER — Emergency Department
Admission: EM | Admit: 2018-02-20 | Discharge: 2018-02-20 | Disposition: A | Discharge status: Home / Self Care | Payer: Self-pay | Attending: Emergency Medicine | Admitting: Emergency Medicine

## 2018-02-20 ENCOUNTER — Emergency Department: Payer: Self-pay

## 2018-02-20 ENCOUNTER — Other Ambulatory Visit: Payer: Self-pay

## 2018-02-20 DIAGNOSIS — R109 Unspecified abdominal pain: Secondary | ICD-10-CM

## 2018-02-20 DIAGNOSIS — Z87891 Personal history of nicotine dependence: Secondary | ICD-10-CM | POA: Insufficient documentation

## 2018-02-20 DIAGNOSIS — R1031 Right lower quadrant pain: Secondary | ICD-10-CM | POA: Insufficient documentation

## 2018-02-20 LAB — URINALYSIS, COMPLETE (UACMP) WITH MICROSCOPIC
BILIRUBIN URINE: NEGATIVE
Bacteria, UA: NONE SEEN
GLUCOSE, UA: NEGATIVE mg/dL
HGB URINE DIPSTICK: NEGATIVE
KETONES UR: NEGATIVE mg/dL
LEUKOCYTES UA: NEGATIVE
NITRITE: NEGATIVE
Protein, ur: NEGATIVE mg/dL
Specific Gravity, Urine: 1.016 (ref 1.005–1.030)
Squamous Epithelial / LPF: NONE SEEN (ref 0–5)
pH: 6 (ref 5.0–8.0)

## 2018-02-20 LAB — COMPREHENSIVE METABOLIC PANEL
ALK PHOS: 46 U/L (ref 38–126)
ALT: 44 U/L (ref 0–44)
ANION GAP: 5 (ref 5–15)
AST: 32 U/L (ref 15–41)
Albumin: 4.2 g/dL (ref 3.5–5.0)
BUN: 21 mg/dL — ABNORMAL HIGH (ref 6–20)
CALCIUM: 8.6 mg/dL — AB (ref 8.9–10.3)
CO2: 28 mmol/L (ref 22–32)
Chloride: 108 mmol/L (ref 98–111)
Creatinine, Ser: 1.15 mg/dL (ref 0.61–1.24)
GFR calc non Af Amer: >60 mL/min (ref >60)
Glucose, Bld: 100 mg/dL — ABNORMAL HIGH (ref 70–99)
Potassium: 3.9 mmol/L (ref 3.5–5.1)
SODIUM: 141 mmol/L (ref 135–145)
Total Bilirubin: 1.2 mg/dL (ref 0.3–1.2)
Total Protein: 7.4 g/dL (ref 6.5–8.1)

## 2018-02-20 LAB — CBC
HCT: 41.8 % (ref 40.0–52.0)
HEMOGLOBIN: 14.4 g/dL (ref 13.0–18.0)
MCH: 31.8 pg (ref 26.0–34.0)
MCHC: 34.6 g/dL (ref 32.0–36.0)
MCV: 92 fL (ref 80.0–100.0)
Platelets: 211 10*3/uL (ref 150–440)
RBC: 4.54 MIL/uL (ref 4.40–5.90)
RDW: 13.1 % (ref 11.5–14.5)
WBC: 5.8 10*3/uL (ref 3.8–10.6)

## 2018-02-20 LAB — LIPASE, BLOOD: LIPASE: 35 U/L (ref 11–51)

## 2018-02-20 MED ORDER — PREDNISONE 10 MG (21) PO TBPK: ORAL_TABLET | Freq: Every day | ORAL | 0 refills | Status: DC

## 2018-02-20 MED ORDER — DIAZEPAM 5 MG PO TABS: 5.0000 mg | ORAL_TABLET | Freq: Three times a day (TID) | ORAL | 0 refills | Status: DC | PRN

## 2018-02-20 MED ORDER — KETOROLAC TROMETHAMINE 60 MG/2ML IM SOLN
60.0000 mg | Freq: Once | INTRAMUSCULAR | Status: AC
  Administered 2018-02-20: 60 mg via INTRAMUSCULAR
  Filled 2018-02-20: qty 2

## 2018-02-20 MED ORDER — OXYCODONE-ACETAMINOPHEN 5-325 MG PO TABS: 2.0000 | ORAL_TABLET | Freq: Once | ORAL | Status: DC

## 2018-02-20 MED ORDER — IBUPROFEN 800 MG PO TABS: 800.0000 mg | ORAL_TABLET | Freq: Three times a day (TID) | ORAL | 0 refills | Status: DC | PRN

## 2018-02-20 NOTE — ED Provider Notes (Signed)
[facility] Emergency Department Provider Note       Time seen: ----------------------------------------- 7:47 AM on 02/20/2018 -----------------------------------------   I have reviewed the triage vital signs and the nursing notes.  HISTORY   Chief Complaint Flank Pain and Abdominal Pain    HPI Daniel Ruiz is a 37 y.o. male with no significant past medical history who presents to the ED for right flank pain that radiates into his right lower quadrant.  Patient states flank pain started on Tuesday, describes as a cramping pain.  This pain is progressively gotten worse and traveled to the abdomen more anteriorly.  He denies any nausea or vomiting, had one episode of diarrhea here.  He denies any fevers, he does do a lot of heavy lifting with his job.  History reviewed. No pertinent past medical history.  There are no active problems to display for this patient.   Past Surgical History:  Procedure Laterality Date  . ORIF FINGER / THUMB FRACTURE Right     Allergies Patient has no known allergies.  Social History Social History   Tobacco Use  . Smoking status: Former Games developermoker  . Smokeless tobacco: Never Used  Substance Use Topics  . Alcohol use: Yes  . Drug use: Yes    Frequency: 3.0 times per week    Types: Marijuana   Review of Systems Constitutional: Negative for fever. Cardiovascular: Negative for chest pain. Respiratory: Negative for shortness of breath. Gastrointestinal: Positive for abdominal pain, flank pain Musculoskeletal: Positive for back pain Skin: Negative for rash. Neurological: Negative for headaches, focal weakness or numbness.  All systems negative/normal/unremarkable except as stated in the HPI  ____________________________________________   PHYSICAL EXAM:  VITAL SIGNS: ED Triage Vitals  Enc Vitals Group     BP 02/20/18 0638 (!) 145/102     Pulse Rate 02/20/18 0638 85     Resp 02/20/18 0638 20     Temp --       Temp Source 02/20/18 0638 Oral     SpO2 02/20/18 0638 100 %     Weight 02/20/18 0639 238 lb 1.6 oz (108 kg)     Height 02/20/18 0639 6\' 3"  (1.905 m)     Head Circumference --      Peak Flow --      Pain Score 02/20/18 0638 7     Pain Loc --      Pain Edu? --      Excl. in GC? --    Constitutional: Alert and oriented. Well appearing and in no distress. Eyes: Conjunctivae are normal. Normal extraocular movements. ENT   Head: Normocephalic and atraumatic.   Nose: No congestion/rhinnorhea.   Mouth/Throat: Mucous membranes are moist.   Neck: No stridor. Cardiovascular: Normal rate, regular rhythm. No murmurs, rubs, or gallops. Respiratory: Normal respiratory effort without tachypnea nor retractions. Breath sounds are clear and equal bilaterally. No wheezes/rales/rhonchi. Gastrointestinal: Right flank tenderness, no rebound or guarding.  Normal bowel sounds. Musculoskeletal: Nontender with normal range of motion in extremities. No lower extremity tenderness nor edema. Neurologic:  Normal speech and language. No gross focal neurologic deficits are appreciated.  Skin:  Skin is warm, dry and intact. No rash noted. Psychiatric: Mood and affect are normal. Speech and behavior are normal.   ____________________________________________  ED COURSE:  As part of my medical decision making, I reviewed the following data within the electronic MEDICAL RECORD NUMBER History obtained from family if available, nursing notes, old chart and ekg, as well as notes from  prior ED visits. Patient presented for flank pain, we will assess with labs and imaging as indicated at this time.   Procedures ____________________________________________   LABS (pertinent positives/negatives)  Labs Reviewed  COMPREHENSIVE METABOLIC PANEL - Abnormal; Notable for the following components:      Result Value   Glucose, Bld 100 (*)    BUN 21 (*)    Calcium 8.6 (*)    All other components within normal  limits  URINALYSIS, COMPLETE (UACMP) WITH MICROSCOPIC - Abnormal; Notable for the following components:   Color, Urine STRAW (*)    APPearance CLEAR (*)    All other components within normal limits  LIPASE, BLOOD  CBC    RADIOLOGY Images were viewed by me  CT renal protocol IMPRESSION: 1. Urinary bladder wall is mildly thickened. Question a degree of cystitis. Correlation with urinalysis advised in this regard.  2. No renal or ureteral calculus on either side. No hydronephrosis.  3. No evident bowel obstruction. No abscess in the abdomen or pelvis. Appendix appears normal. ____________________________________________  DIFFERENTIAL DIAGNOSIS   Musculoskeletal pain, bulging disc, renal colic, UTI, pyelonephritis  FINAL ASSESSMENT AND PLAN  Flank pain   Plan: The patient had presented for right flank pain. Patient's labs not reveal any acute process. Patient's imaging was negative, this is likely musculoskeletal in origin.  We will try a dose of steroids given the radicular component, pain medicine.  I have advised heating pad, massage and stretching.   Ulice DashJohnathan E Williams, MD   Note: This note was generated in part or whole with voice recognition software. Voice recognition is usually quite accurate but there are transcription errors that can and very often do occur. I apologize for any typographical errors that were not detected and corrected.     Emily FilbertWilliams, Jonathan E, MD 02/20/18 1010

## 2018-02-20 NOTE — ED Notes (Signed)
Pt in CT at this time.

## 2018-02-20 NOTE — ED Triage Notes (Signed)
Patient to ER for c/o right flank pain that radiates to RLQ abd. Patient states flank pain started on Tuesday, has progressively gotten worse and traveled to abdomen. Patient denies any N/V/D. Denies any fevers. Denies any urinary symptoms.

## 2018-02-28 ENCOUNTER — Encounter (HOSPITAL_COMMUNITY): Payer: Self-pay | Admitting: *Deleted

## 2018-02-28 ENCOUNTER — Emergency Department (HOSPITAL_COMMUNITY)
Admission: EM | Admit: 2018-02-28 | Discharge: 2018-02-28 | Disposition: A | Discharge status: Home / Self Care | Payer: Self-pay | Attending: Emergency Medicine | Admitting: Emergency Medicine

## 2018-02-28 DIAGNOSIS — M546 Pain in thoracic spine: Secondary | ICD-10-CM | POA: Insufficient documentation

## 2018-02-28 DIAGNOSIS — Z87891 Personal history of nicotine dependence: Secondary | ICD-10-CM | POA: Insufficient documentation

## 2018-02-28 DIAGNOSIS — Z79899 Other long term (current) drug therapy: Secondary | ICD-10-CM | POA: Insufficient documentation

## 2018-02-28 MED ORDER — KETOROLAC TROMETHAMINE 60 MG/2ML IM SOLN
60.0000 mg | Freq: Once | INTRAMUSCULAR | Status: DC
  Filled 2018-02-28: qty 2

## 2018-02-28 MED ORDER — METHOCARBAMOL 500 MG PO TABS
500.0000 mg | ORAL_TABLET | Freq: Once | ORAL | Status: AC
  Administered 2018-02-28: 500 mg via ORAL
  Filled 2018-02-28: qty 1

## 2018-02-28 MED ORDER — ACETAMINOPHEN 325 MG PO TABS
650.0000 mg | ORAL_TABLET | Freq: Once | ORAL | Status: AC
  Administered 2018-02-28: 650 mg via ORAL
  Filled 2018-02-28: qty 2

## 2018-02-28 NOTE — ED Triage Notes (Signed)
Pt complains of mid back pain radiating to right hip since moving a bed last week.

## 2018-02-28 NOTE — Discharge Instructions (Addendum)
Continue the medicine prescribed last week

## 2018-02-28 NOTE — ED Provider Notes (Signed)
[facility]-EMERGENCY DEPT Provider Note   CSN: [id number] Arrival date & time: 02/28/18  1111     History   Chief Complaint Chief Complaint  Patient presents with  . Back Pain    HPI Daniel Ruiz is a 37 y.o. male.  HPI Patient reports ongoing thoracic back pain.  Recent seen in the emergency department diagnosed with musculoskeletal pain.  Reports improvement for several days but now has re-exacerbated his back pain.  No difficulty breathing.  No fevers or chills.  No unilateral arm or leg weakness.  Denies chest pain.  No abdominal pain.  Symptoms are mild to moderate in severity.  No other complaints at this time.  No new complaints.  No dysuria or urinary frequency   History reviewed. No pertinent past medical history.  There are no active problems to display for this patient.   Past Surgical History:  Procedure Laterality Date  . ORIF FINGER / THUMB FRACTURE Right         Home Medications    Prior to Admission medications   Medication Sig Start Date End Date Taking? Authorizing Provider  diazepam (VALIUM) 5 MG tablet Take 1 tablet (5 mg total) by mouth every 8 (eight) hours as needed for muscle spasms. 02/20/18   Emily Filbert, MD  ibuprofen (ADVIL,MOTRIN) 800 MG tablet Take 1 tablet (800 mg total) by mouth every 8 (eight) hours as needed. 02/20/18   Emily Filbert, MD  methocarbamol (ROBAXIN) 750 MG tablet Take 1 tablet (750 mg total) by mouth 4 (four) times daily. 03/21/16   Beers, Charmayne Sheer, PA-C  naproxen (NAPROSYN) 500 MG tablet Take 1 tablet (500 mg total) by mouth 2 (two) times daily with a meal. 03/21/16   Beers, Charmayne Sheer, PA-C  ondansetron (ZOFRAN ODT) 4 MG disintegrating tablet Take 1 tablet (4 mg total) by mouth every 8 (eight) hours as needed for nausea or vomiting. 01/07/17   Emily Filbert, MD  predniSONE (DELTASONE) 10 MG tablet Take 5 tablets (50 mg total) by mouth daily with breakfast. 03/21/16   Beers, Charmayne Sheer,  PA-C  predniSONE (STERAPRED UNI-PAK 21 TAB) 10 MG (21) TBPK tablet Take by mouth daily. Dispense steroid taper pack as directed 02/20/18   Emily Filbert, MD    Family History No family history on file.  Social History Social History   Tobacco Use  . Smoking status: Former Games developer  . Smokeless tobacco: Never Used  Substance Use Topics  . Alcohol use: Yes  . Drug use: Yes    Frequency: 3.0 times per week    Types: Marijuana     Allergies   Patient has no known allergies.   Review of Systems Review of Systems  All other systems reviewed and are negative.    Physical Exam Updated Vital Signs BP 131/90   Pulse 88   Temp (!) 97.5 F (36.4 C) (Oral)   Resp 18   SpO2 99%   Physical Exam  Constitutional: He is oriented to person, place, and time. He appears well-developed and well-nourished.  HENT:  Head: Normocephalic and atraumatic.  Eyes: EOM are normal.  Neck: Normal range of motion.  Cardiovascular: Normal rate, regular rhythm, normal heart sounds and intact distal pulses.  Pulmonary/Chest: Effort normal and breath sounds normal. No respiratory distress.  Abdominal: Soft. He exhibits no distension. There is no tenderness.  Musculoskeletal: Normal range of motion.  Mild parathoracic tenderness without significant spasm.  No thoracic or lumbar point tenderness.  Neurological: He is alert and oriented to person, place, and time.  Skin: Skin is warm and dry.  Psychiatric: He has a normal mood and affect. Judgment normal.  Nursing note and vitals reviewed.    ED Treatments / Results  Labs (all labs ordered are listed, but only abnormal results are displayed) Labs Reviewed - No data to display  EKG None  Radiology No results found.  Procedures Procedures (including critical care time)  Medications Ordered in ED Medications  methocarbamol (ROBAXIN) tablet 500 mg (500 mg Oral Given 02/28/18 1207)  acetaminophen (TYLENOL) tablet 650 mg (650 mg Oral  Given 02/28/18 1207)     Initial Impression / Assessment and Plan / ED Course  I have reviewed the triage vital signs and the nursing notes.  Pertinent labs & imaging results that were available during my care of the patient were reviewed by me and considered in my medical decision making (see chart for details).     Overall well-appearing.  Suspect musculoskeletal pain.  No indication for advanced imaging.  No weakness of his arms or legs.  I do not think he needs MRI imaging of his back.  Abdomen is benign.  Discharged home in good condition  Final Clinical Impressions(s) / ED Diagnoses   Final diagnoses:  Acute right-sided thoracic back pain    ED Discharge Orders    None       Azalia Bilisampos, Peggie Hornak, MD 02/28/18 [phone]

## 2018-04-14 ENCOUNTER — Emergency Department (HOSPITAL_COMMUNITY)
Admission: EM | Admit: 2018-04-14 | Discharge: 2018-04-14 | Disposition: A | Discharge status: Home / Self Care | Payer: Self-pay | Attending: Emergency Medicine | Admitting: Emergency Medicine

## 2018-04-14 ENCOUNTER — Encounter (HOSPITAL_COMMUNITY): Payer: Self-pay | Admitting: Emergency Medicine

## 2018-04-14 DIAGNOSIS — R197 Diarrhea, unspecified: Secondary | ICD-10-CM | POA: Insufficient documentation

## 2018-04-14 DIAGNOSIS — Z79899 Other long term (current) drug therapy: Secondary | ICD-10-CM | POA: Insufficient documentation

## 2018-04-14 DIAGNOSIS — R112 Nausea with vomiting, unspecified: Secondary | ICD-10-CM | POA: Insufficient documentation

## 2018-04-14 DIAGNOSIS — Z87891 Personal history of nicotine dependence: Secondary | ICD-10-CM | POA: Insufficient documentation

## 2018-04-14 LAB — CBC
HCT: 41.8 % (ref 39.0–52.0)
HEMOGLOBIN: 14.5 g/dL (ref 13.0–17.0)
MCH: 31.6 pg (ref 26.0–34.0)
MCHC: 34.7 g/dL (ref 30.0–36.0)
MCV: 91.1 fL (ref 78.0–100.0)
Platelets: 232 10*3/uL (ref 150–400)
RBC: 4.59 MIL/uL (ref 4.22–5.81)
RDW: 12.4 % (ref 11.5–15.5)
WBC: 5.4 10*3/uL (ref 4.0–10.5)

## 2018-04-14 LAB — URINALYSIS, ROUTINE W REFLEX MICROSCOPIC
BILIRUBIN URINE: NEGATIVE
Glucose, UA: NEGATIVE mg/dL
HGB URINE DIPSTICK: NEGATIVE
Ketones, ur: NEGATIVE mg/dL
Leukocytes, UA: NEGATIVE
Nitrite: NEGATIVE
PH: 5 (ref 5.0–8.0)
PROTEIN: NEGATIVE mg/dL
SPECIFIC GRAVITY, URINE: 1.018 (ref 1.005–1.030)

## 2018-04-14 LAB — COMPREHENSIVE METABOLIC PANEL
ALBUMIN: 4.1 g/dL (ref 3.5–5.0)
ALK PHOS: 43 U/L (ref 38–126)
ALT: 32 U/L (ref 0–44)
ANION GAP: 10 (ref 5–15)
AST: 23 U/L (ref 15–41)
BILIRUBIN TOTAL: 0.9 mg/dL (ref 0.3–1.2)
BUN: 15 mg/dL (ref 6–20)
CALCIUM: 8.8 mg/dL — AB (ref 8.9–10.3)
CO2: 22 mmol/L (ref 22–32)
Chloride: 106 mmol/L (ref 98–111)
Creatinine, Ser: 1.05 mg/dL (ref 0.61–1.24)
GFR calc Af Amer: >60 mL/min (ref >60)
GFR calc non Af Amer: >60 mL/min (ref >60)
GLUCOSE: 95 mg/dL (ref 70–99)
Potassium: 4.1 mmol/L (ref 3.5–5.1)
SODIUM: 138 mmol/L (ref 135–145)
TOTAL PROTEIN: 7.4 g/dL (ref 6.5–8.1)

## 2018-04-14 LAB — LIPASE, BLOOD: Lipase: 33 U/L (ref 11–51)

## 2018-04-14 MED ORDER — ONDANSETRON 8 MG PO TBDP: 8.0000 mg | ORAL_TABLET | Freq: Three times a day (TID) | ORAL | 0 refills | Status: DC | PRN

## 2018-04-14 MED ORDER — ONDANSETRON 8 MG PO TBDP
8.0000 mg | ORAL_TABLET | Freq: Once | ORAL | Status: AC
  Administered 2018-04-14: 8 mg via ORAL
  Filled 2018-04-14: qty 1

## 2018-04-14 NOTE — Discharge Instructions (Signed)
We suspect that you are having a self-limited episode of nausea, vomiting and diarrhea because of food mediated process or mild infectious process. We recommend clear liquid diet for the next 2 days, and then advancing to solid foods. Take the nausea medication.  Please return to the ER if your symptoms worsen; you have increased pain, fevers, chills, inability to keep any medications down, confusion.

## 2018-04-14 NOTE — ED Provider Notes (Addendum)
[facility]-EMERGENCY DEPT Provider Note   CSN: [id number] Arrival date & time: 04/14/18  0802     History   Chief Complaint Chief Complaint  Patient presents with  . Emesis  . Diarrhea    HPI Daniel Ruiz is a 37 y.o. male.  HPI  37 year old male comes in with chief complaint of nausea, vomiting and diarrhea.  Patient reports he woke up this morning feeling nauseated, however once he got to work he had 2 episodes of vomiting and 2 loose bowel movements.  Emesis was nonbilious and nonbloody.  BM were loose and watery and also nonbloody.  Patient denies any abdominal pain.  He had salmon yesterday, which he thinks he left out to thaw for a long time.  Otherwise patient is healthy and does not have any medical problems.  Review of system is negative for fevers and patient denies any recent travel history.  History reviewed. No pertinent past medical history.  There are no active problems to display for this patient.   Past Surgical History:  Procedure Laterality Date  . ORIF FINGER / THUMB FRACTURE Right         Home Medications    Prior to Admission medications   Medication Sig Start Date End Date Taking? Authorizing Provider  ibuprofen (ADVIL,MOTRIN) 800 MG tablet Take 1 tablet (800 mg total) by mouth every 8 (eight) hours as needed. 02/20/18  Yes Emily Filbert, MD  methocarbamol (ROBAXIN) 750 MG tablet Take 1 tablet (750 mg total) by mouth 4 (four) times daily. 03/21/16  Yes Beers, Charmayne Sheer, PA-C  naproxen (NAPROSYN) 500 MG tablet Take 1 tablet (500 mg total) by mouth 2 (two) times daily with a meal. 03/21/16  Yes Beers, Charmayne Sheer, PA-C  diazepam (VALIUM) 5 MG tablet Take 1 tablet (5 mg total) by mouth every 8 (eight) hours as needed for muscle spasms. Patient not taking: Reported on 04/14/2018 02/20/18   Emily Filbert, MD  ondansetron (ZOFRAN-ODT) 8 MG disintegrating tablet Take 1 tablet (8 mg total) by mouth every 8 (eight) hours as  needed for nausea or vomiting. 04/14/18   Derwood Kaplan, MD  predniSONE (DELTASONE) 10 MG tablet Take 5 tablets (50 mg total) by mouth daily with breakfast. Patient not taking: Reported on 04/14/2018 03/21/16   Beers, Charmayne Sheer, PA-C  predniSONE (STERAPRED UNI-PAK 21 TAB) 10 MG (21) TBPK tablet Take by mouth daily. Dispense steroid taper pack as directed Patient not taking: Reported on 04/14/2018 02/20/18   Emily Filbert, MD    Family History No family history on file.  Social History Social History   Tobacco Use  . Smoking status: Former Games developer  . Smokeless tobacco: Never Used  Substance Use Topics  . Alcohol use: Yes  . Drug use: Yes    Frequency: 3.0 times per week    Types: Marijuana     Allergies   Patient has no known allergies.   Review of Systems Review of Systems  Constitutional: Positive for activity change.  Gastrointestinal: Positive for nausea and vomiting.  Allergic/Immunologic: Negative for immunocompromised state.  Neurological: Negative for dizziness.     Physical Exam Updated Vital Signs BP (!) 143/75 (BP Location: Right Arm)   Pulse 66   Temp 98 F (36.7 C) (Oral)   Resp 18   SpO2 100%   Physical Exam  Constitutional: He is oriented to person, place, and time. He appears well-developed.  HENT:  Head: Atraumatic.  Neck: Neck supple.  Cardiovascular: Normal  rate.  Pulmonary/Chest: Effort normal.  Abdominal: Soft.  Neurological: He is alert and oriented to person, place, and time.  Skin: Skin is warm.  Nursing note and vitals reviewed.    ED Treatments / Results  Labs (all labs ordered are listed, but only abnormal results are displayed) Labs Reviewed  COMPREHENSIVE METABOLIC PANEL - Abnormal; Notable for the following components:      Result Value   Calcium 8.8 (*)    All other components within normal limits  LIPASE, BLOOD  CBC  URINALYSIS, ROUTINE W REFLEX MICROSCOPIC    EKG None  Radiology No results  found.  Procedures Procedures (including critical care time)  Medications Ordered in ED Medications  ondansetron (ZOFRAN-ODT) disintegrating tablet 8 mg (8 mg Oral Given 04/14/18 0931)     Initial Impression / Assessment and Plan / ED Course  I have reviewed the triage vital signs and the nursing notes.  Pertinent labs & imaging results that were available during my care of the patient were reviewed by me and considered in my medical decision making (see chart for details).  Clinical Course as of Apr 15 1043  Mon Apr 14, 2018  1044 Pt reassessed. Pt's VSS and WNL. Pt's cap refill < 3 seconds. Pt has been hydrated in the ER and now passed po challenge. We will discharge with antiemetic. Strict ER return precautions have been discussed and pt will return if he is unable to tolerate fluids and symptoms are getting worse.    [AN]    Clinical Course User Index [AN] Derwood Kaplan, MD    Patient comes in with chief complaint of nausea, vomiting and diarrhea. Patient has had 2 loose bowel movements and 2 emesis episodes this morning.  He has no abdominal pain, vital signs are reassuring.  This appears to be either food toxin mediated or viral in nature.  No travel history and review of system overall is not concerning.  We will try to get symptoms and better control.  Labs were sent from the triage, which appeared normal.  Final Clinical Impressions(s) / ED Diagnoses   Final diagnoses:  Nausea vomiting and diarrhea    ED Discharge Orders         Ordered    ondansetron (ZOFRAN-ODT) 8 MG disintegrating tablet  Every 8 hours PRN     04/14/18 1041           Derwood Kaplan, MD 04/14/18 0930    Derwood Kaplan, MD 04/14/18 1045

## 2018-04-14 NOTE — ED Notes (Signed)
Patient given saltine crackers and water for PO challenge. Will continue to monitor.

## 2018-04-14 NOTE — ED Triage Notes (Signed)
Pt reports that after getting to work this morning he started having n/v/d. Reports little abd pains.

## 2018-07-21 ENCOUNTER — Encounter (HOSPITAL_COMMUNITY): Payer: Self-pay

## 2018-07-21 ENCOUNTER — Other Ambulatory Visit: Payer: Self-pay

## 2018-07-21 ENCOUNTER — Emergency Department (HOSPITAL_COMMUNITY)
Admission: EM | Admit: 2018-07-21 | Discharge: 2018-07-21 | Disposition: A | Discharge status: Home / Self Care | Payer: Self-pay | Attending: Emergency Medicine | Admitting: Emergency Medicine

## 2018-07-21 ENCOUNTER — Emergency Department (HOSPITAL_COMMUNITY): Payer: Self-pay

## 2018-07-21 DIAGNOSIS — Z87891 Personal history of nicotine dependence: Secondary | ICD-10-CM | POA: Insufficient documentation

## 2018-07-21 DIAGNOSIS — B9789 Other viral agents as the cause of diseases classified elsewhere: Secondary | ICD-10-CM

## 2018-07-21 DIAGNOSIS — J069 Acute upper respiratory infection, unspecified: Secondary | ICD-10-CM | POA: Insufficient documentation

## 2018-07-21 DIAGNOSIS — Z79899 Other long term (current) drug therapy: Secondary | ICD-10-CM | POA: Insufficient documentation

## 2018-07-21 HISTORY — DX: Cardiac arrhythmia, unspecified: I49.9

## 2018-07-21 MED ORDER — IBUPROFEN 800 MG PO TABS: 800.0000 mg | ORAL_TABLET | Freq: Three times a day (TID) | ORAL | 0 refills | Status: DC

## 2018-07-21 MED ORDER — FLUTICASONE PROPIONATE 50 MCG/ACT NA SUSP: 1.0000 | Freq: Every day | NASAL | 0 refills | Status: DC

## 2018-07-21 MED ORDER — BENZONATATE 100 MG PO CAPS: 100.0000 mg | ORAL_CAPSULE | Freq: Three times a day (TID) | ORAL | 0 refills | Status: DC

## 2018-07-21 MED ORDER — ALBUTEROL SULFATE HFA 108 (90 BASE) MCG/ACT IN AERS
1.0000 | INHALATION_SPRAY | Freq: Once | RESPIRATORY_TRACT | Status: AC
  Administered 2018-07-21: 2 via RESPIRATORY_TRACT
  Filled 2018-07-21: qty 6.7

## 2018-07-21 NOTE — ED Notes (Signed)
Bed: WTR7 Expected date:  Expected time:  Means of arrival:  Comments: 

## 2018-07-21 NOTE — Discharge Instructions (Addendum)
You were seen in the emergency today for upper respiratory symptoms, we suspect your symptoms are related to allergies or a virus at this time. Your chest xray was normal. We have prescribed you multiple medications to treat your symptoms.   -Flonase to be used 1 spray in each nostril daily.  This medication is used to treat your congestion.  -Tessalon can be taken once every 8 hours as needed.  This medication is used to treat your cough.  -Ibuprofen to be taken once every 8 hours as needed for pain. Please take this medicine with food as it can cause stomach upset and at worst stomach bleeding. Do not take other NSAIDs such as motrin, aleve, advil, naproxen, mobic, etc as they are similar. You make take tylenol per over the counter dosing with this medicine safely.  - Inhaler (albuterol)- use 1-2 puffs every 4-6 hours as needed for wheezing or trouble breathing.   Please try to stop smoking   We have prescribed you new medication(s) today. Discuss the medications prescribed today with your pharmacist as they can have adverse effects and interactions with your other medicines including over the counter and prescribed medications. Seek medical evaluation if you start to experience new or abnormal symptoms after taking one of these medicines, seek care immediately if you start to experience difficulty breathing, feeling of your throat closing, facial swelling, or rash as these could be indications of a more serious allergic reaction  You will need to follow-up with your primary care provider in 1 week if your symptoms have not improved.  If you do not have a primary care provider one is provided in your discharge instructions.  Return to the emergency department for any new or worsening symptoms including but not limited to persistent fever for 5 days, difficulty breathing, chest pain, rashes, passing out, or any other concerns.    Additionally have your blood pressure rechecked at your primary care  appointment as it was elevated in the ER today.

## 2018-07-21 NOTE — ED Provider Notes (Signed)
[facility]-EMERGENCY DEPT Provider Note   CSN: [id number] Arrival date & time: 07/21/18  1124     History   Chief Complaint Chief Complaint  Patient presents with  . Cough    HPI Daniel Ruiz is a 38 y.o. male with a hx of irregular heart beat who presents to the ED with complaints of URI sxs x 1 week. Patient states initially started with congestion, ear pain (R>L) and sore throat, has subsequently developed cough productive of yellow phlegm sputum. He feels he is wheezing sometimes. Tried mucinex & throat spray OTC without much relief. Denies fever, chills, dyspnea, chest pain, or vomiting. Recently started smoking again after having quit.   HPI  Past Medical History:  Diagnosis Date  . Irregular heart beat     There are no active problems to display for this patient.   Past Surgical History:  Procedure Laterality Date  . ORIF FINGER / THUMB FRACTURE Right         Home Medications    Prior to Admission medications   Medication Sig Start Date End Date Taking? Authorizing Provider  diazepam (VALIUM) 5 MG tablet Take 1 tablet (5 mg total) by mouth every 8 (eight) hours as needed for muscle spasms. Patient not taking: Reported on 04/14/2018 02/20/18   Emily Filbert, MD  ibuprofen (ADVIL,MOTRIN) 800 MG tablet Take 1 tablet (800 mg total) by mouth every 8 (eight) hours as needed. 02/20/18   Emily Filbert, MD  methocarbamol (ROBAXIN) 750 MG tablet Take 1 tablet (750 mg total) by mouth 4 (four) times daily. 03/21/16   Beers, Charmayne Sheer, PA-C  naproxen (NAPROSYN) 500 MG tablet Take 1 tablet (500 mg total) by mouth 2 (two) times daily with a meal. 03/21/16   Beers, Charmayne Sheer, PA-C  ondansetron (ZOFRAN-ODT) 8 MG disintegrating tablet Take 1 tablet (8 mg total) by mouth every 8 (eight) hours as needed for nausea or vomiting. 04/14/18   Derwood Kaplan, MD  predniSONE (DELTASONE) 10 MG tablet Take 5 tablets (50 mg total) by mouth daily with  breakfast. Patient not taking: Reported on 04/14/2018 03/21/16   Beers, Charmayne Sheer, PA-C  predniSONE (STERAPRED UNI-PAK 21 TAB) 10 MG (21) TBPK tablet Take by mouth daily. Dispense steroid taper pack as directed Patient not taking: Reported on 04/14/2018 02/20/18   Emily Filbert, MD    Family History Family History  Problem Relation Age of Onset  . Diabetes Mother     Social History Social History   Tobacco Use  . Smoking status: Former Games developer  . Smokeless tobacco: Never Used  Substance Use Topics  . Alcohol use: Yes  . Drug use: Yes    Frequency: 3.0 times per week    Types: Marijuana     Allergies   Patient has no known allergies.   Review of Systems Review of Systems  Constitutional: Negative for chills and fever.  HENT: Positive for congestion, ear pain and sore throat.   Respiratory: Positive for cough and wheezing. Negative for shortness of breath.   Cardiovascular: Negative for chest pain.  Gastrointestinal: Negative for vomiting.     Physical Exam Updated Vital Signs Pulse (!) 105   Temp 98.3 F (36.8 C) (Oral)   Resp 18   Ht 6\' 3"  (1.905 m)   Wt 117.9 kg   SpO2 98%   BMI 32.50 kg/m   Physical Exam Vitals signs and nursing note reviewed.  Constitutional:      General: He is not  in acute distress.    Appearance: He is well-developed. He is not toxic-appearing.  HENT:     Head: Normocephalic and atraumatic.     Right Ear: Ear canal and external ear normal. Tympanic membrane is not perforated, erythematous, retracted or bulging.     Left Ear: Ear canal and external ear normal. Tympanic membrane is not perforated, erythematous, retracted or bulging.     Ears:     Comments: TMs appear somewhat full bilaterally without loss of landmarks.     Nose: Mucosal edema present.     Right Sinus: No maxillary sinus tenderness or frontal sinus tenderness.     Left Sinus: No maxillary sinus tenderness or frontal sinus tenderness.     Mouth/Throat:     Mouth:  Mucous membranes are moist.     Pharynx: Posterior oropharyngeal erythema present. No oropharyngeal exudate.     Tonsils: Swelling: 1+ on the right. 1+ on the left.     Comments: Posterior oropharynx is symmetric appearing. Patient tolerating own secretions without difficulty. No trismus. No drooling. No hot potato voice. No swelling beneath the tongue, submandibular compartment is soft.  Eyes:     General:        Right eye: No discharge.        Left eye: No discharge.     Conjunctiva/sclera: Conjunctivae normal.  Neck:     Musculoskeletal: Neck supple. No edema, erythema, neck rigidity or crepitus.  Cardiovascular:     Rate and Rhythm: Normal rate and regular rhythm.  Pulmonary:     Effort: Pulmonary effort is normal. No respiratory distress.     Breath sounds: Wheezing (very mild end expiratory noted at the bases and left upper lobe) present. No rhonchi or rales.  Abdominal:     General: There is no distension.     Palpations: Abdomen is soft.     Tenderness: There is no abdominal tenderness.  Lymphadenopathy:     Cervical: No cervical adenopathy.  Skin:    General: Skin is warm and dry.     Findings: No rash.  Neurological:     Mental Status: He is alert.     Comments: Clear speech.   Psychiatric:        Behavior: Behavior normal.    ED Treatments / Results  Labs (all labs ordered are listed, but only abnormal results are displayed) Labs Reviewed - No data to display  EKG None  Radiology Dg Chest 2 View  Result Date: 07/21/2018 CLINICAL DATA:  Sore throat for the past week with recent onset productive cough. EXAM: CHEST - 2 VIEW COMPARISON:  None. FINDINGS: No consolidative process, pneumothorax or effusion is identified. Subtle linear opacities in the right upper lobe are likely due to scar. Heart size is normal. No pneumothorax or pleural fluid. No acute or focal bony abnormality. IMPRESSION: No acute disease. Electronically Signed   By: Drusilla Kannerhomas  Dalessio M.D.   On:  07/21/2018 12:45    Procedures Procedures (including critical care time)  Medications Ordered in ED Medications  albuterol (PROVENTIL HFA;VENTOLIN HFA) 108 (90 Base) MCG/ACT inhaler 1-2 puff (2 puffs Inhalation Given 07/21/18 1227)     Initial Impression / Assessment and Plan / ED Course  I have reviewed the triage vital signs and the nursing notes.  Pertinent labs & imaging results that were available during my care of the patient were reviewed by me and considered in my medical decision making (see chart for details).    Patient presents with URI  type symptoms.  Patient is nontoxic appearing, in no apparent distress, vitals without significant abnormality, BP elevated- doubt HTN emergency- PCP recheck. Initially had some minimal end expiratory wheeze on lung exam, no respiratory distress, cleared with repeat lung exam following albuterol inhaler.  No hx of asthma/COPD, has been smoking again recently- smoking cessation preformed. CXR negative for infiltrate, doubt pneumonia.  Sxs onset < 10 days, afebrile, no sinus tenderness, doubt acute bacterial sinusitis. Centor score 0, doubt strep pharyngitis. No evidence of AOM on exam. No meningeal signs. Suspect viral at this time and will treat supportively with Ibuprofen, Flonase, and Tessalon. Will also have patient take inhaler home with him for PRN use.  I discussed results, treatment plan, need for PCP follow-up, and return precautions with the patient. Provided opportunity for questions, patient confirmed understanding and is in agreement with plan.    Final Clinical Impressions(s) / ED Diagnoses   Final diagnoses:  Viral URI with cough    ED Discharge Orders         Ordered    fluticasone (FLONASE) 50 MCG/ACT nasal spray  Daily     07/21/18 1320    benzonatate (TESSALON) 100 MG capsule  Every 8 hours     07/21/18 1320    ibuprofen (ADVIL,MOTRIN) 800 MG tablet  3 times daily     07/21/18 1320           Petrucelli, WillapaSamantha R,  PA-C 07/21/18 1321    Derwood KaplanNanavati, Ankit, MD 07/22/18 1700

## 2018-07-21 NOTE — ED Triage Notes (Signed)
Patient reports that he had a sore throat last week and recently developed a productive cough with yellow/brown sputum.

## 2018-08-03 ENCOUNTER — Emergency Department (HOSPITAL_COMMUNITY)
Admission: EM | Admit: 2018-08-03 | Discharge: 2018-08-04 | Disposition: A | Discharge status: Home / Self Care | Payer: Self-pay | Attending: Emergency Medicine | Admitting: Emergency Medicine

## 2018-08-03 ENCOUNTER — Emergency Department (HOSPITAL_COMMUNITY): Payer: Self-pay

## 2018-08-03 ENCOUNTER — Encounter (HOSPITAL_COMMUNITY): Payer: Self-pay

## 2018-08-03 ENCOUNTER — Other Ambulatory Visit: Payer: Self-pay

## 2018-08-03 DIAGNOSIS — J0100 Acute maxillary sinusitis, unspecified: Secondary | ICD-10-CM | POA: Insufficient documentation

## 2018-08-03 DIAGNOSIS — Z87891 Personal history of nicotine dependence: Secondary | ICD-10-CM | POA: Insufficient documentation

## 2018-08-03 DIAGNOSIS — Z79899 Other long term (current) drug therapy: Secondary | ICD-10-CM | POA: Insufficient documentation

## 2018-08-03 LAB — URINALYSIS, ROUTINE W REFLEX MICROSCOPIC
BACTERIA UA: NONE SEEN
Bilirubin Urine: NEGATIVE
Glucose, UA: NEGATIVE mg/dL
Ketones, ur: NEGATIVE mg/dL
Leukocytes, UA: NEGATIVE
Nitrite: NEGATIVE
PROTEIN: NEGATIVE mg/dL
Specific Gravity, Urine: 1.021 (ref 1.005–1.030)
pH: 5 (ref 5.0–8.0)

## 2018-08-03 LAB — COMPREHENSIVE METABOLIC PANEL
ALBUMIN: 4.5 g/dL (ref 3.5–5.0)
ALT: 34 U/L (ref 0–44)
AST: 21 U/L (ref 15–41)
Alkaline Phosphatase: 47 U/L (ref 38–126)
Anion gap: 7 (ref 5–15)
BILIRUBIN TOTAL: 1.2 mg/dL (ref 0.3–1.2)
BUN: 17 mg/dL (ref 6–20)
CALCIUM: 9 mg/dL (ref 8.9–10.3)
CHLORIDE: 103 mmol/L (ref 98–111)
CO2: 28 mmol/L (ref 22–32)
CREATININE: 1.04 mg/dL (ref 0.61–1.24)
GFR calc Af Amer: >60 mL/min (ref >60)
GFR calc non Af Amer: >60 mL/min (ref >60)
Glucose, Bld: 92 mg/dL (ref 70–99)
Potassium: 3.3 mmol/L — ABNORMAL LOW (ref 3.5–5.1)
SODIUM: 138 mmol/L (ref 135–145)
Total Protein: 8 g/dL (ref 6.5–8.1)

## 2018-08-03 LAB — CBC
HEMATOCRIT: 43.9 % (ref 39.0–52.0)
Hemoglobin: 14.4 g/dL (ref 13.0–17.0)
MCH: 30.6 pg (ref 26.0–34.0)
MCHC: 32.8 g/dL (ref 30.0–36.0)
MCV: 93.2 fL (ref 80.0–100.0)
Platelets: 242 10*3/uL (ref 150–400)
RBC: 4.71 MIL/uL (ref 4.22–5.81)
RDW: 12.1 % (ref 11.5–15.5)
WBC: 6.2 10*3/uL (ref 4.0–10.5)
nRBC: 0 % (ref 0.0–0.2)

## 2018-08-03 LAB — LIPASE, BLOOD: Lipase: 30 U/L (ref 11–51)

## 2018-08-03 MED ORDER — IPRATROPIUM-ALBUTEROL 0.5-2.5 (3) MG/3ML IN SOLN
3.0000 mL | Freq: Once | RESPIRATORY_TRACT | Status: AC
  Administered 2018-08-03: 3 mL via RESPIRATORY_TRACT
  Filled 2018-08-03: qty 3

## 2018-08-03 MED ORDER — SODIUM CHLORIDE 0.9% FLUSH: 3.0000 mL | Freq: Once | INTRAVENOUS | Status: DC

## 2018-08-03 NOTE — ED Triage Notes (Signed)
Pt reports 2 episodes of epistaxis this week with today being the most recent. Pt also reports vomiting earlier today preceding the nosebleed. Pt prescribed a nose spray last week, but was unable to get the prescription filled.

## 2018-08-03 NOTE — ED Provider Notes (Signed)
[facility]-EMERGENCY DEPT Provider Note   CSN: [id number] Arrival date & time: 08/03/18  2137     History   Chief Complaint Chief Complaint  Patient presents with  . Epistaxis  . Emesis    HPI Daniel Ruiz is a 38 y.o. male.   Patient presents with 2 or 3 episodes of epistaxis within the past 3 days.  States he has had bloody mucus when he blows his nose but no active bleeding from his right nare.  This is happened 3 times in the past 3 days and is been controlled with pressure.  Does not take any blood thinners.  Patient has had congestion and productive cough with several episodes of posttussive vomiting and some vomiting after blowing his nose.  He was seen in the ED on January 13 and diagnosed with a URI which was treated with Tessalon Perles.  He was prescribed Flonase but did not fill this.  He does smoke cigarettes but denies any history of COPD or asthma.  No chest pain or shortness of breath.  No abdominal pain.  Endorses headache, ear fullness and throat soreness.  No difficulty swallowing or difficulty breathing.  The history is provided by the patient.  Epistaxis  Associated symptoms: sinus pain and sore throat   Associated symptoms: no congestion, no cough, no dizziness, no fever and no headaches   Emesis  Associated symptoms: sore throat   Associated symptoms: no abdominal pain, no arthralgias, no cough, no fever and no headaches     Past Medical History:  Diagnosis Date  . Irregular heart beat     There are no active problems to display for this patient.   Past Surgical History:  Procedure Laterality Date  . ORIF FINGER / THUMB FRACTURE Right         Home Medications    Prior to Admission medications   Medication Sig Start Date End Date Taking? Authorizing Provider  benzonatate (TESSALON) 100 MG capsule Take 1 capsule (100 mg total) by mouth every 8 (eight) hours. 07/21/18   Petrucelli, Samantha R, PA-C  fluticasone (FLONASE)  50 MCG/ACT nasal spray Place 1 spray into both nostrils daily. 07/21/18   Petrucelli, Samantha R, PA-C  ibuprofen (ADVIL,MOTRIN) 800 MG tablet Take 1 tablet (800 mg total) by mouth 3 (three) times daily. 07/21/18   Petrucelli, Samantha R, PA-C  methocarbamol (ROBAXIN) 750 MG tablet Take 1 tablet (750 mg total) by mouth 4 (four) times daily. 03/21/16   Beers, Charmayne Sheer, PA-C  naproxen (NAPROSYN) 500 MG tablet Take 1 tablet (500 mg total) by mouth 2 (two) times daily with a meal. 03/21/16   Beers, Charmayne Sheer, PA-C  ondansetron (ZOFRAN-ODT) 8 MG disintegrating tablet Take 1 tablet (8 mg total) by mouth every 8 (eight) hours as needed for nausea or vomiting. 04/14/18   Derwood Kaplan, MD    Family History Family History  Problem Relation Age of Onset  . Diabetes Mother     Social History Social History   Tobacco Use  . Smoking status: Former Games developer  . Smokeless tobacco: Never Used  Substance Use Topics  . Alcohol use: Yes  . Drug use: Yes    Frequency: 3.0 times per week    Types: Marijuana     Allergies   Patient has no known allergies.   Review of Systems Review of Systems  Constitutional: Negative for activity change, appetite change, fatigue and fever.  HENT: Positive for nosebleeds, rhinorrhea, sinus pressure, sinus pain and sore  throat. Negative for congestion.   Respiratory: Negative for cough.   Cardiovascular: Negative for chest pain.  Gastrointestinal: Positive for nausea and vomiting. Negative for abdominal pain.  Genitourinary: Negative for dysuria and hematuria.  Musculoskeletal: Negative for arthralgias.  Skin: Negative for rash.  Neurological: Negative for dizziness and headaches.  Marland Kitchen. all other systems are negative except as noted in the HPI and PMH.     Physical Exam Updated Vital Signs BP 140/89 (BP Location: Right Arm)   Pulse 86   Temp 98.1 F (36.7 C) (Oral)   Resp 16   SpO2 97%   Physical Exam Vitals signs and nursing note reviewed.  Constitutional:       General: He is not in acute distress.    Appearance: Normal appearance. He is well-developed and normal weight.     Comments: Congested voice.  HENT:     Head: Normocephalic and atraumatic.     Ears:     Comments: TMs obscured by cerumen bilaterally    Nose: Congestion present.     Comments: Dried blood on right nasal septum with area of scab.  No active bleeding.    Mouth/Throat:     Mouth: Mucous membranes are moist.     Pharynx: No oropharyngeal exudate.     Comments: Oropharynx clear, no asymmetry or exudates Eyes:     Conjunctiva/sclera: Conjunctivae normal.     Pupils: Pupils are equal, round, and reactive to light.  Neck:     Musculoskeletal: Normal range of motion and neck supple.     Comments: No meningismus. Cardiovascular:     Rate and Rhythm: Normal rate and regular rhythm.     Heart sounds: Normal heart sounds. No murmur.  Pulmonary:     Effort: Pulmonary effort is normal. No respiratory distress.     Breath sounds: Normal breath sounds.  Chest:     Chest wall: No tenderness.  Abdominal:     Palpations: Abdomen is soft.     Tenderness: There is no abdominal tenderness. There is no guarding or rebound.  Musculoskeletal: Normal range of motion.        General: No tenderness.  Skin:    General: Skin is warm.     Capillary Refill: Capillary refill takes less than 2 seconds.  Neurological:     General: No focal deficit present.     Mental Status: He is alert and oriented to person, place, and time. Mental status is at baseline.     Cranial Nerves: No cranial nerve deficit.     Motor: No abnormal muscle tone.     Coordination: Coordination normal.     Comments: No ataxia on finger to nose bilaterally. No pronator drift. 5/5 strength throughout. CN 2-12 intact.Equal grip strength. Sensation intact.   Psychiatric:        Behavior: Behavior normal.      ED Treatments / Results  Labs (all labs ordered are listed, but only abnormal results are  displayed) Labs Reviewed  COMPREHENSIVE METABOLIC PANEL - Abnormal; Notable for the following components:      Result Value   Potassium 3.3 (*)    All other components within normal limits  URINALYSIS, ROUTINE W REFLEX MICROSCOPIC - Abnormal; Notable for the following components:   Hgb urine dipstick SMALL (*)    All other components within normal limits  LIPASE, BLOOD  CBC    EKG None  Radiology Dg Neck Soft Tissue  Result Date: 08/04/2018 CLINICAL DATA:  Sore throat EXAM: NECK  SOFT TISSUES - 1+ VIEW COMPARISON:  None. FINDINGS: There is no evidence of retropharyngeal soft tissue swelling or epiglottic enlargement. The cervical airway is unremarkable and no radio-opaque foreign body identified. Degenerative changes at C4-C5 and C5-C6. IMPRESSION: Negative. Electronically Signed   By: Jasmine Pang M.D.   On: 08/04/2018 00:06   Dg Chest 2 View  Result Date: 08/03/2018 CLINICAL DATA:  Cough EXAM: CHEST - 2 VIEW COMPARISON:  07/21/2018 FINDINGS: Hyperlucent right upper lobe with linear scarring. This finding is unchanged. No pleural effusion. Normal heart size. No pneumothorax. IMPRESSION: 1. No acute pulmonary infiltrate 2. Hyperlucent right upper lobe with probable linear scarring, question underlying emphysematous disease in the right upper lobe. Electronically Signed   By: Jasmine Pang M.D.   On: 08/03/2018 23:32    Procedures Procedures (including critical care time)  Medications Ordered in ED Medications  sodium chloride flush (NS) 0.9 % injection 3 mL (has no administration in time range)  ipratropium-albuterol (DUONEB) 0.5-2.5 (3) MG/3ML nebulizer solution 3 mL (has no administration in time range)     Initial Impression / Assessment and Plan / ED Course  I have reviewed the triage vital signs and the nursing notes.  Pertinent labs & imaging results that were available during my care of the patient were reviewed by me and considered in my medical decision making (see  chart for details).    Episodes of bloody mucus from his nose as well as productive cough with recent diagnosis of viral URI.  Intermittent vomiting after blowing nose and coughing.  Patient well-appearing in no distress.  No active nasal bleeding.  Lungs are clear without wheezing.  Chest x-ray is negative.  Lungs are clear.  Given length of illness, we will treat for sinusitis with antibiotics.  Smoking cessation encouraged.  Will also give Afrin nasal spray and decongestants.  Discussed oral hydration at home, antipyretics, PCP follow-up, return precautions discussed.  Final Clinical Impressions(s) / ED Diagnoses   Final diagnoses:  Acute maxillary sinusitis, recurrence not specified    ED Discharge Orders    None       Oree Mirelez, Jeannett Senior, MD 08/04/18 [phone]

## 2018-08-03 NOTE — ED Notes (Signed)
Patient transported to x-ray. ?

## 2018-08-04 MED ORDER — AMOXICILLIN-POT CLAVULANATE 875-125 MG PO TABS: 1.0000 | ORAL_TABLET | Freq: Two times a day (BID) | ORAL | 0 refills | Status: DC

## 2018-08-04 MED ORDER — AMOXICILLIN-POT CLAVULANATE 875-125 MG PO TABS
1.0000 | ORAL_TABLET | Freq: Once | ORAL | Status: AC
  Administered 2018-08-04: 1 via ORAL
  Filled 2018-08-04: qty 1

## 2018-08-04 MED ORDER — IBUPROFEN 800 MG PO TABS
800.0000 mg | ORAL_TABLET | Freq: Once | ORAL | Status: AC
  Administered 2018-08-04: 800 mg via ORAL
  Filled 2018-08-04: qty 1

## 2018-08-04 MED ORDER — POTASSIUM CHLORIDE CRYS ER 20 MEQ PO TBCR
40.0000 meq | EXTENDED_RELEASE_TABLET | Freq: Once | ORAL | Status: AC
  Administered 2018-08-04: 40 meq via ORAL
  Filled 2018-08-04: qty 2

## 2018-08-04 MED ORDER — OXYMETAZOLINE HCL 0.05 % NA SOLN: 1.0000 | Freq: Two times a day (BID) | NASAL | 0 refills | Status: DC

## 2018-08-04 NOTE — ED Notes (Signed)
Patient complaining of headache and states that he would like something for his pain. EDP made aware of request.

## 2018-08-04 NOTE — Discharge Instructions (Addendum)
Take the antibiotics as prescribed.  Stop using the nasal spray after 2 days.  Follow-up with her primary care doctor.  Stop smoking.  Return to the ED with new or worsening symptoms.

## 2018-10-02 ENCOUNTER — Encounter (HOSPITAL_COMMUNITY): Payer: Self-pay

## 2018-10-02 ENCOUNTER — Emergency Department (HOSPITAL_COMMUNITY)
Admission: EM | Admit: 2018-10-02 | Discharge: 2018-10-02 | Disposition: A | Discharge status: Home / Self Care | Payer: Self-pay | Attending: Emergency Medicine | Admitting: Emergency Medicine

## 2018-10-02 ENCOUNTER — Other Ambulatory Visit: Payer: Self-pay

## 2018-10-02 DIAGNOSIS — L0231 Cutaneous abscess of buttock: Secondary | ICD-10-CM | POA: Insufficient documentation

## 2018-10-02 DIAGNOSIS — F17210 Nicotine dependence, cigarettes, uncomplicated: Secondary | ICD-10-CM | POA: Insufficient documentation

## 2018-10-02 DIAGNOSIS — Z79899 Other long term (current) drug therapy: Secondary | ICD-10-CM | POA: Insufficient documentation

## 2018-10-02 MED ORDER — SULFAMETHOXAZOLE-TRIMETHOPRIM 800-160 MG PO TABS: 1.0000 | ORAL_TABLET | Freq: Two times a day (BID) | ORAL | 0 refills | Status: AC

## 2018-10-02 NOTE — Discharge Instructions (Signed)
Soak area 2-3 times a day.  Start the antibiotic if it becomes hard or more painful and seems to be spreading

## 2018-10-02 NOTE — ED Provider Notes (Signed)
[facility]-EMERGENCY DEPT Provider Note   CSN: [id number] Arrival date & time: 10/02/18  1710    History   Chief Complaint Chief Complaint  Patient presents with  . Abscess    HPI Daniel Ruiz is a 38 y.o. male.     The history is provided by the patient.  Abscess  Location:  Pelvis Pelvic abscess location:  L buttock Size:  Unknown Abscess quality: painful   Abscess quality: not draining   Red streaking: no   Duration:  2 weeks Progression:  Partially resolved Pain details:    Quality:  Sharp and shooting   Severity:  Moderate   Duration:  2 weeks   Timing:  Constant   Progression:  Partially resolved Chronicity:  New Context: not diabetes, not immunosuppression and not skin injury   Relieved by:  Draining/squeezing Worsened by:  Warm compresses Ineffective treatments:  None tried Associated symptoms: no fever, no nausea and no vomiting   Associated symptoms comment:  States that he squeezed it yesterday and a lot of pus and blood came out which continued today and worried him Risk factors: no prior abscess     Past Medical History:  Diagnosis Date  . Irregular heart beat     There are no active problems to display for this patient.   Past Surgical History:  Procedure Laterality Date  . ORIF FINGER / THUMB FRACTURE Right         Home Medications    Prior to Admission medications   Medication Sig Start Date End Date Taking? Authorizing Provider  amoxicillin-clavulanate (AUGMENTIN) 875-125 MG tablet Take 1 tablet by mouth every 12 (twelve) hours. 08/04/18   Rancour, Jeannett Senior, MD  benzonatate (TESSALON) 100 MG capsule Take 1 capsule (100 mg total) by mouth every 8 (eight) hours. 07/21/18   Petrucelli, Samantha R, PA-C  fluticasone (FLONASE) 50 MCG/ACT nasal spray Place 1 spray into both nostrils daily. 07/21/18   Petrucelli, Samantha R, PA-C  ibuprofen (ADVIL,MOTRIN) 800 MG tablet Take 1 tablet (800 mg total) by mouth 3 (three)  times daily. 07/21/18   Petrucelli, Samantha R, PA-C  methocarbamol (ROBAXIN) 750 MG tablet Take 1 tablet (750 mg total) by mouth 4 (four) times daily. 03/21/16   Beers, Charmayne Sheer, PA-C  naproxen (NAPROSYN) 500 MG tablet Take 1 tablet (500 mg total) by mouth 2 (two) times daily with a meal. 03/21/16   Beers, Charmayne Sheer, PA-C  ondansetron (ZOFRAN-ODT) 8 MG disintegrating tablet Take 1 tablet (8 mg total) by mouth every 8 (eight) hours as needed for nausea or vomiting. 04/14/18   Derwood Kaplan, MD  oxymetazoline (AFRIN NASAL SPRAY) 0.05 % nasal spray Place 1 spray into both nostrils 2 (two) times daily. x3 days then stop 08/04/18   Rancour, Jeannett Senior, MD  sulfamethoxazole-trimethoprim (BACTRIM DS,SEPTRA DS) 800-160 MG tablet Take 1 tablet by mouth 2 (two) times daily for 7 days. 10/02/18 10/09/18  Gwyneth Sprout, MD    Family History Family History  Problem Relation Age of Onset  . Diabetes Mother     Social History Social History   Tobacco Use  . Smoking status: Current Every Day Smoker    Packs/day: 0.50    Types: Cigarettes  . Smokeless tobacco: Never Used  Substance Use Topics  . Alcohol use: Yes  . Drug use: Yes    Frequency: 3.0 times per week    Types: Marijuana     Allergies   Patient has no known allergies.   Review of Systems  Review of Systems  Constitutional: Negative for fever.  Gastrointestinal: Negative for nausea and vomiting.  All other systems reviewed and are negative.    Physical Exam Updated Vital Signs BP (!) 147/89 (BP Location: Right Arm)   Pulse 83   Temp 97.6 F (36.4 C) (Oral)   Resp 16   Ht 6\' 3"  (1.905 m)   Wt 108.9 kg   SpO2 99%   BMI 30.00 kg/m   Physical Exam Vitals signs and nursing note reviewed.  Constitutional:      Appearance: Normal appearance. He is normal weight.  HENT:     Head: Normocephalic.  Cardiovascular:     Rate and Rhythm: Normal rate.  Pulmonary:     Effort: Pulmonary effort is normal.  Skin:    General: Skin  is warm.       Neurological:     General: No focal deficit present.     Mental Status: He is alert. Mental status is at baseline.  Psychiatric:        Mood and Affect: Mood normal.        Behavior: Behavior normal.        Thought Content: Thought content normal.      ED Treatments / Results  Labs (all labs ordered are listed, but only abnormal results are displayed) Labs Reviewed - No data to display  EKG None  Radiology No results found.  Procedures Procedures (including critical care time)  Medications Ordered in ED Medications - No data to display   Initial Impression / Assessment and Plan / ED Course  I have reviewed the triage vital signs and the nursing notes.  Pertinent labs & imaging results that were available during my care of the patient were reviewed by me and considered in my medical decision making (see chart for details).       38 year old male presenting with draining abscess of his left buttocks.  He does not have significant induration or fluctuance at this time.  It is already draining.  Patient given a prescription for antibiotics only to fill if the area starts to become worse.  Encouraged continued soaks but at this time no need for I&D.  No rectal involvement or scrotal involvement.  Patient is otherwise well-appearing.  Final Clinical Impressions(s) / ED Diagnoses   Final diagnoses:  Abscess of buttock, left    ED Discharge Orders         Ordered    sulfamethoxazole-trimethoprim (BACTRIM DS,SEPTRA DS) 800-160 MG tablet  2 times daily     10/02/18 1806           Gwyneth Sprout, MD 10/02/18 1830

## 2018-10-02 NOTE — ED Triage Notes (Signed)
Patient states that he has an abscess on the left buttock and states that "it busted yesterday and is still draining."

## 2019-01-13 ENCOUNTER — Other Ambulatory Visit: Payer: Self-pay

## 2019-01-13 ENCOUNTER — Emergency Department (HOSPITAL_COMMUNITY): Payer: Self-pay

## 2019-01-13 ENCOUNTER — Emergency Department (HOSPITAL_COMMUNITY)
Admission: EM | Admit: 2019-01-13 | Discharge: 2019-01-13 | Disposition: A | Discharge status: Home / Self Care | Payer: Self-pay | Attending: Emergency Medicine | Admitting: Emergency Medicine

## 2019-01-13 ENCOUNTER — Encounter (HOSPITAL_COMMUNITY): Payer: Self-pay

## 2019-01-13 DIAGNOSIS — R109 Unspecified abdominal pain: Secondary | ICD-10-CM

## 2019-01-13 DIAGNOSIS — R1011 Right upper quadrant pain: Secondary | ICD-10-CM | POA: Insufficient documentation

## 2019-01-13 DIAGNOSIS — L729 Follicular cyst of the skin and subcutaneous tissue, unspecified: Secondary | ICD-10-CM

## 2019-01-13 DIAGNOSIS — L0231 Cutaneous abscess of buttock: Secondary | ICD-10-CM | POA: Insufficient documentation

## 2019-01-13 DIAGNOSIS — F17210 Nicotine dependence, cigarettes, uncomplicated: Secondary | ICD-10-CM | POA: Insufficient documentation

## 2019-01-13 DIAGNOSIS — R1031 Right lower quadrant pain: Secondary | ICD-10-CM | POA: Insufficient documentation

## 2019-01-13 LAB — COMPREHENSIVE METABOLIC PANEL
ALT: 30 U/L (ref 0–44)
AST: 20 U/L (ref 15–41)
Albumin: 3.9 g/dL (ref 3.5–5.0)
Alkaline Phosphatase: 53 U/L (ref 38–126)
Anion gap: 6 (ref 5–15)
BUN: 15 mg/dL (ref 6–20)
CO2: 27 mmol/L (ref 22–32)
Calcium: 8.6 mg/dL — ABNORMAL LOW (ref 8.9–10.3)
Chloride: 108 mmol/L (ref 98–111)
Creatinine, Ser: 1.14 mg/dL (ref 0.61–1.24)
GFR calc Af Amer: >60 mL/min (ref >60)
GFR calc non Af Amer: >60 mL/min (ref >60)
Glucose, Bld: 121 mg/dL — ABNORMAL HIGH (ref 70–99)
Potassium: 3.9 mmol/L (ref 3.5–5.1)
Sodium: 141 mmol/L (ref 135–145)
Total Bilirubin: 0.8 mg/dL (ref 0.3–1.2)
Total Protein: 7.3 g/dL (ref 6.5–8.1)

## 2019-01-13 LAB — URINALYSIS, ROUTINE W REFLEX MICROSCOPIC
Bilirubin Urine: NEGATIVE
Glucose, UA: NEGATIVE mg/dL
Hgb urine dipstick: NEGATIVE
Ketones, ur: NEGATIVE mg/dL
Leukocytes,Ua: NEGATIVE
Nitrite: NEGATIVE
Protein, ur: NEGATIVE mg/dL
Specific Gravity, Urine: 1.018 (ref 1.005–1.030)
pH: 5 (ref 5.0–8.0)

## 2019-01-13 LAB — CBC
HCT: 44.7 % (ref 39.0–52.0)
Hemoglobin: 14.8 g/dL (ref 13.0–17.0)
MCH: 30.9 pg (ref 26.0–34.0)
MCHC: 33.1 g/dL (ref 30.0–36.0)
MCV: 93.3 fL (ref 80.0–100.0)
Platelets: 216 10*3/uL (ref 150–400)
RBC: 4.79 MIL/uL (ref 4.22–5.81)
RDW: 12.2 % (ref 11.5–15.5)
WBC: 5.4 10*3/uL (ref 4.0–10.5)
nRBC: 0 % (ref 0.0–0.2)

## 2019-01-13 LAB — LIPASE, BLOOD: Lipase: 34 U/L (ref 11–51)

## 2019-01-13 MED ORDER — SODIUM CHLORIDE 0.9% FLUSH
3.0000 mL | Freq: Once | INTRAVENOUS | Status: AC
  Administered 2019-01-13: 3 mL via INTRAVENOUS

## 2019-01-13 NOTE — ED Provider Notes (Signed)
[facility]-EMERGENCY DEPT Provider Note   CSN: [id number] Arrival date & time: 01/13/19  1104    History   Chief Complaint Chief Complaint  Patient presents with  . Abdominal Pain  . Abscess    HPI Daniel Ruiz is a 38 y.o. male with no significant PMHx who presents ot the ED with c/o right-sided abdominal pain and buttock abscess.   Daniel Ruiz states his right-sided abdominal pain began approximately 3 days ago. It is described as intermittent with a stabbing or cramping feeling and lasts up to 5 minutes at a time. Occasionally the pain is so severe, it makes him kneel over. He says its happened before, but never so often as in the past three days. He denies exacerbation of pain by eating, nor is pain alleviated by eating or using the restroom. Pain does not radiate anywhere else. No alleviating or exacerbating factors. Sometimes, pain will occur during BM. He denies fever, chills, nausea, vomiting, diarrhea, constipation, CVA tenderness. No changes in urinary or BMs. No recent changes to diet. He is able to eat and drink as normal since pain onset. He denies any rhinorrhea, sore throat, cough, SOB or CP.   Daniel Ruiz also notes a left buttock abscess that has been draining intermittently. It is the same abscess he was seen in this ED for in March 2020. He was prescribed Augmentin and states he did relieve his infection at that time. The abscess has since reoccurred but it has not been draining pus or been painful. He will just notice some clear or bloody drainage from it occasionally.   The history is provided by the patient.  Abdominal Pain Associated symptoms: no chest pain, no chills, no constipation, no cough, no diarrhea, no dysuria, no fever, no hematuria, no nausea, no shortness of breath, no sore throat and no vomiting   Abscess Associated symptoms: no fever, no nausea and no vomiting     Past Medical History:  Diagnosis Date  . Irregular heart beat      There are no active problems to display for this patient.   Past Surgical History:  Procedure Laterality Date  . ORIF FINGER / THUMB FRACTURE Right         Home Medications    Prior to Admission medications   Medication Sig Start Date End Date Taking? Authorizing Provider  amoxicillin-clavulanate (AUGMENTIN) 875-125 MG tablet Take 1 tablet by mouth every 12 (twelve) hours. Patient not taking: Reported on 01/13/2019 08/04/18   Glynn Octaveancour, Stephen, MD  benzonatate (TESSALON) 100 MG capsule Take 1 capsule (100 mg total) by mouth every 8 (eight) hours. Patient not taking: Reported on 01/13/2019 07/21/18   Petrucelli, Lelon MastSamantha R, PA-C  fluticasone (FLONASE) 50 MCG/ACT nasal spray Place 1 spray into both nostrils daily. Patient not taking: Reported on 01/13/2019 07/21/18   Petrucelli, Pleas KochSamantha R, PA-C  ibuprofen (ADVIL,MOTRIN) 800 MG tablet Take 1 tablet (800 mg total) by mouth 3 (three) times daily. Patient not taking: Reported on 01/13/2019 07/21/18   Petrucelli, Pleas KochSamantha R, PA-C  methocarbamol (ROBAXIN) 750 MG tablet Take 1 tablet (750 mg total) by mouth 4 (four) times daily. Patient not taking: Reported on 01/13/2019 03/21/16   Evangeline DakinBeers, Charles M, PA-C  naproxen (NAPROSYN) 500 MG tablet Take 1 tablet (500 mg total) by mouth 2 (two) times daily with a meal. Patient not taking: Reported on 01/13/2019 03/21/16   Evangeline DakinBeers, Charles M, PA-C  ondansetron (ZOFRAN-ODT) 8 MG disintegrating tablet Take 1 tablet (8 mg total) by mouth every  8 (eight) hours as needed for nausea or vomiting. Patient not taking: Reported on 01/13/2019 04/14/18   Derwood KaplanNanavati, Ankit, MD  oxymetazoline (AFRIN NASAL SPRAY) 0.05 % nasal spray Place 1 spray into both nostrils 2 (two) times daily. x3 days then stop Patient not taking: Reported on 01/13/2019 08/04/18   Glynn Octaveancour, Stephen, MD    Family History Family History  Problem Relation Age of Onset  . Diabetes Mother     Social History Social History   Tobacco Use  . Smoking status: Current  Every Day Smoker    Packs/day: 0.50    Types: Cigarettes  . Smokeless tobacco: Never Used  Substance Use Topics  . Alcohol use: Yes  . Drug use: Yes    Frequency: 3.0 times per week    Types: Marijuana     Allergies   Patient has no known allergies.   Review of Systems Review of Systems  Constitutional: Negative for appetite change, chills and fever.  HENT: Negative for rhinorrhea and sore throat.   Respiratory: Negative for cough and shortness of breath.   Cardiovascular: Negative for chest pain.  Gastrointestinal: Positive for abdominal pain. Negative for abdominal distention, constipation, diarrhea, nausea and vomiting.  Genitourinary: Negative for difficulty urinating, dysuria, flank pain, frequency and hematuria.  Musculoskeletal: Negative for back pain and myalgias.  Skin:       Left-buttock abscess  Allergic/Immunologic: Negative for immunocompromised state.  All other systems reviewed and are negative.    Physical Exam Updated Vital Signs BP (!) 154/108 (BP Location: Right Arm)   Pulse 91   Temp 98.4 F (36.9 C) (Oral)   Resp 17   Ht 6\' 3"  (1.905 m)   Wt 102.1 kg   SpO2 100%   BMI 28.12 kg/m   Physical Exam Vitals signs and nursing note reviewed.  Constitutional:      General: He is not in acute distress.    Appearance: He is well-developed. He is not ill-appearing.  Eyes:     General: No scleral icterus. Cardiovascular:     Rate and Rhythm: Normal rate and regular rhythm.     Heart sounds: Normal heart sounds. No murmur. No gallop.   Pulmonary:     Effort: Pulmonary effort is normal. No respiratory distress.     Breath sounds: Normal breath sounds. No wheezing or rales.  Abdominal:     General: Bowel sounds are normal. There is no distension. There are no signs of injury.     Palpations: Abdomen is soft.     Tenderness: There is abdominal tenderness (very mild) in the right upper quadrant and right lower quadrant. There is no right CVA tenderness,  left CVA tenderness, guarding or rebound.  Skin:    General: Skin is warm and dry.     Capillary Refill: Capillary refill takes less than 2 seconds.       Neurological:     General: No focal deficit present.     Mental Status: He is alert and oriented to person, place, and time.  Psychiatric:        Mood and Affect: Mood normal.        Behavior: Behavior normal.      ED Treatments / Results  Labs (all labs ordered are listed, but only abnormal results are displayed) Labs Reviewed  COMPREHENSIVE METABOLIC PANEL - Abnormal; Notable for the following components:      Result Value   Glucose, Bld 121 (*)    Calcium 8.6 (*)  All other components within normal limits  LIPASE, BLOOD  CBC  URINALYSIS, ROUTINE W REFLEX MICROSCOPIC    EKG None  Radiology Ct Renal Stone Study  Result Date: 01/13/2019 CLINICAL DATA:  Acute right flank pain. EXAM: CT ABDOMEN AND PELVIS WITHOUT CONTRAST TECHNIQUE: Multidetector CT imaging of the abdomen and pelvis was performed following the standard protocol without IV contrast. COMPARISON:  Radiograph of February 20, 2018. FINDINGS: Lower chest: No acute abnormality. Hepatobiliary: No focal liver abnormality is seen. No gallstones, gallbladder wall thickening, or biliary dilatation. Pancreas: Unremarkable. No pancreatic ductal dilatation or surrounding inflammatory changes. Spleen: Normal in size without focal abnormality. Adrenals/Urinary Tract: Adrenal glands are unremarkable. Kidneys are normal, without renal calculi, focal lesion, or hydronephrosis. Bladder is unremarkable. Stomach/Bowel: Stomach is within normal limits. Appendix appears normal. No evidence of bowel wall thickening, distention, or inflammatory changes. Vascular/Lymphatic: No significant vascular findings are present. No enlarged abdominal or pelvic lymph nodes. Reproductive: Prostate is unremarkable. Other: No abdominal wall hernia or abnormality. No abdominopelvic ascites. Musculoskeletal:  No acute or significant osseous findings. IMPRESSION: No definite abnormality seen in the abdomen or pelvis. Electronically Signed   By: Marijo Conception M.D.   On: 01/13/2019 14:57    Procedures Procedures (including critical care time)  Medications Ordered in ED Medications  sodium chloride flush (NS) 0.9 % injection 3 mL (3 mLs Intravenous Given 01/13/19 1213)     Initial Impression / Assessment and Plan / ED Course  I have reviewed the triage vital signs and the nursing notes.  Pertinent labs & imaging results that were available during my care of the patient were reviewed by me and considered in my medical decision making (see chart for details).  Clinical Course as of Jan 12 1800  Tue Jan 13, 2019  1228 Comprehensive metabolic panel [IB]    Clinical Course User Index [IB] Jose Persia, MD   Daniel Ruiz is a healthy 38 y/o male who comes in with c/o right sided abdominal pain and a left-buttock abscess.   Orest's abdominal pain is non-specific except for being right-sided, which raises concerns regarding liver, gall bladder, pancreas and kidney. CBC, CMP, urinalysis were ordered for further evaluation, as well as CT Renal with contrast. CBC came back with no abnormal results. CMP and Lipase were also WNL.Patient's glucose was elevated at 121, but this was due to eating within the last 2 hours. Urinalysis was negative. CT abdomen was negative. At this time, it rules out any acute process involving liver, gallbladder, pancreas or kidney.   Discussed with patient that food intolerances or IBS may be another cause of abdominal pain. Discussed keeping a food diary and finding a PCP for outpatient follow up. He agrees with this plan and is in the process of looking for a PCP currently.   Jovoni also c/o of a left-buttock abscess today that has been draining non-purulent drainage for the past few days. This is the same abscess that he was seen at this ED in March 2020. At that  time, it did have purulent drainage and patient was prescribed Augmentin. At this time, there does not appear to be any infection on examination. Discussed with patient that re-occurrence may indicate this is a cyst, not an abscess. Recommended Epsom baths for symptomatic relief when the drainage is giving him problems. Instructed to return to ED if any signs of infection develop and discussed what those are. Recommended follow up with dermatology for cyst removal.  Aedan understands and agrees with  plans. He is safe for discharge at this time.    Final Clinical Impressions(s) / ED Diagnoses   Final diagnoses:  Abdominal pain, unspecified abdominal location  Cyst of buttocks    ED Discharge Orders    None       Verdene LennertBasaraba, Marquavis Hannen, MD 01/13/19 Flossie [id number]    Gerhard MunchLockwood, Robert, MD 01/14/19 [phone]

## 2019-01-13 NOTE — ED Triage Notes (Signed)
Patient c/o right lower abdominal pain x 3 days. Patient denies any N/v/d Patient also c/o abscess between buttocks.

## 2019-01-13 NOTE — Discharge Instructions (Addendum)
If you symptoms worsen, please return to the ED.   In regards to your cyst: If you develop pus, pain, redness or fever, please return to the ED for work up.   For drainage of cyst, try soaking in Epsom baths for relief.   Follow up with a PCP for further work up of abdominal pain. It would help to keep a food diary of foods that cause discomfort.

## 2019-07-14 ENCOUNTER — Emergency Department (HOSPITAL_COMMUNITY): Payer: Self-pay

## 2019-07-14 ENCOUNTER — Encounter (HOSPITAL_COMMUNITY): Payer: Self-pay | Admitting: *Deleted

## 2019-07-14 ENCOUNTER — Emergency Department (HOSPITAL_COMMUNITY)
Admission: EM | Admit: 2019-07-14 | Discharge: 2019-07-14 | Disposition: A | Discharge status: Home / Self Care | Payer: Self-pay | Attending: Emergency Medicine | Admitting: Emergency Medicine

## 2019-07-14 ENCOUNTER — Other Ambulatory Visit: Payer: Self-pay

## 2019-07-14 DIAGNOSIS — M25522 Pain in left elbow: Secondary | ICD-10-CM | POA: Insufficient documentation

## 2019-07-14 DIAGNOSIS — F17210 Nicotine dependence, cigarettes, uncomplicated: Secondary | ICD-10-CM | POA: Insufficient documentation

## 2019-07-14 DIAGNOSIS — F1410 Cocaine abuse, uncomplicated: Secondary | ICD-10-CM | POA: Insufficient documentation

## 2019-07-14 DIAGNOSIS — Z79899 Other long term (current) drug therapy: Secondary | ICD-10-CM | POA: Insufficient documentation

## 2019-07-14 DIAGNOSIS — R202 Paresthesia of skin: Secondary | ICD-10-CM | POA: Insufficient documentation

## 2019-07-14 DIAGNOSIS — R200 Anesthesia of skin: Secondary | ICD-10-CM | POA: Insufficient documentation

## 2019-07-14 MED ORDER — LIDOCAINE 5 % EX PTCH
1.0000 | MEDICATED_PATCH | CUTANEOUS | Status: DC
  Administered 2019-07-14: 12:00:00 1 via TRANSDERMAL
  Filled 2019-07-14: qty 1

## 2019-07-14 NOTE — ED Notes (Signed)
An After Visit Summary was printed and given to the patient. Discharge instructions given and no further questions at this time.  

## 2019-07-14 NOTE — ED Provider Notes (Signed)
[facility] [facility] DEPT Provider Note   CSN: [id number] Arrival date & time: 07/14/19  1013     History Chief Complaint  Patient presents with  . Arm Pain    Daniel Ruiz is a 39 y.o. male with a past medical history significant for irregular heart beat who presents to the ED due to progressively worsening left elbow pain x 2 weeks. Patient notes he lifts roughly 50lbs at work daily and has had progressively worsening left elbow pain on the lateral aspect. Elbow pain is a 6/10 and worse with movement. Patient has not tried anything for pain. Patient denies direct injury to left elbow. Patient denies associative symptoms of fever, chills, erythema, warmth, and edema. Patient also notes bilateral arm numbness/tingling every morning upon waking up for numerous months. Patient denies neck pain and neck injury. Patient denies upper extremity weakness. Patient notes numbness/tingling quickly goes away after movement of his arms.     Past Medical History:  Diagnosis Date  . Irregular heart beat     There are no problems to display for this patient.   Past Surgical History:  Procedure Laterality Date  . ORIF FINGER / THUMB FRACTURE Right        Family History  Problem Relation Age of Onset  . Diabetes Mother     Social History   Tobacco Use  . Smoking status: Current Every Day Smoker    Packs/day: 0.50    Types: Cigarettes  . Smokeless tobacco: Never Used  Substance Use Topics  . Alcohol use: Yes  . Drug use: Yes    Frequency: 3.0 times per week    Types: Marijuana    Home Medications Prior to Admission medications   Medication Sig Start Date End Date Taking? Authorizing Provider  amoxicillin-clavulanate (AUGMENTIN) 875-125 MG tablet Take 1 tablet by mouth every 12 (twelve) hours. Patient not taking: Reported on 01/13/2019 08/04/18   Ezequiel Essex, MD  benzonatate (TESSALON) 100 MG capsule Take 1 capsule (100 mg total) by mouth every 8 (eight)  hours. Patient not taking: Reported on 01/13/2019 07/21/18   Petrucelli, Aldona Bar R, PA-C  fluticasone (FLONASE) 50 MCG/ACT nasal spray Place 1 spray into both nostrils daily. Patient not taking: Reported on 01/13/2019 07/21/18   Petrucelli, Glynda Jaeger, PA-C  ibuprofen (ADVIL,MOTRIN) 800 MG tablet Take 1 tablet (800 mg total) by mouth 3 (three) times daily. Patient not taking: Reported on 01/13/2019 07/21/18   Petrucelli, Glynda Jaeger, PA-C  methocarbamol (ROBAXIN) 750 MG tablet Take 1 tablet (750 mg total) by mouth 4 (four) times daily. Patient not taking: Reported on 01/13/2019 03/21/16   Arlyss Repress, PA-C  naproxen (NAPROSYN) 500 MG tablet Take 1 tablet (500 mg total) by mouth 2 (two) times daily with a meal. Patient not taking: Reported on 01/13/2019 03/21/16   Arlyss Repress, PA-C  ondansetron (ZOFRAN-ODT) 8 MG disintegrating tablet Take 1 tablet (8 mg total) by mouth every 8 (eight) hours as needed for nausea or vomiting. Patient not taking: Reported on 01/13/2019 04/14/18   Varney Biles, MD  oxymetazoline (AFRIN NASAL SPRAY) 0.05 % nasal spray Place 1 spray into both nostrils 2 (two) times daily. x3 days then stop Patient not taking: Reported on 01/13/2019 08/04/18   Ezequiel Essex, MD    Allergies    Patient has no known allergies.  Review of Systems   Review of Systems  Constitutional: Negative for chills and fever.  Respiratory: Negative for shortness of breath.   Cardiovascular: Negative for chest  pain.  Musculoskeletal: Positive for arthralgias and myalgias. Negative for neck pain and neck stiffness.  Skin: Negative for color change, rash and wound.  Neurological: Positive for numbness. Negative for weakness.    Physical Exam Updated Vital Signs BP (!) 156/113 (BP Location: Right Arm)   Pulse (!) 101   Temp 98.2 F (36.8 C) (Oral)   Resp 18   Ht 6\' 3"  (1.905 m)   Wt 113.4 kg   SpO2 99%   BMI 31.25 kg/m   Physical Exam Vitals and nursing note reviewed.  Constitutional:       General: He is not in acute distress.    Appearance: He is not ill-appearing.  HENT:     Head: Normocephalic.  Eyes:     Conjunctiva/sclera: Conjunctivae normal.     Pupils: Pupils are equal, round, and reactive to light.  Neck:     Comments: No cervical midline tenderness. Full ROM of neck Cardiovascular:     Rate and Rhythm: Normal rate and regular rhythm.     Pulses: Normal pulses.     Heart sounds: Normal heart sounds. No murmur. No friction rub. No gallop.   Pulmonary:     Effort: Pulmonary effort is normal.     Breath sounds: Normal breath sounds.  Abdominal:     General: Abdomen is flat. There is no distension.     Palpations: Abdomen is soft.     Tenderness: There is no abdominal tenderness. There is no guarding or rebound.  Musculoskeletal:        General: Normal range of motion.     Cervical back: Neck supple.     Comments: Left Upper Extremity: Tenderness to palpation over left lateral epicondyle. No deformity or crepitus. No erythema, warmth, or edema. Full ROM of elbow. Normal shoulder and wrist with full ROM and no tenderness to palpation. Radial pulse intact. Sensation intact. Soft compartments.   No thoracic or lumbar midline tenderness.  Skin:    General: Skin is warm.     Capillary Refill: Capillary refill takes less than 2 seconds.  Neurological:     General: No focal deficit present.     Mental Status: He is alert.     ED Results / Procedures / Treatments   Labs (all labs ordered are listed, but only abnormal results are displayed) Labs Reviewed - No data to display  EKG None  Radiology DG Elbow Complete Left  Result Date: 07/14/2019 CLINICAL DATA:  Elbow pain EXAM: LEFT ELBOW - COMPLETE 3+ VIEW COMPARISON:  None. FINDINGS: There is no evidence of fracture, dislocation, or joint effusion. There is no evidence of arthropathy or other focal bone abnormality. Soft tissues are unremarkable. IMPRESSION: Negative. Electronically Signed   By: 09/11/2019  M.D.   On: 07/14/2019 11:46    Procedures Procedures (including critical care time)  Medications Ordered in ED Medications  lidocaine (LIDODERM) 5 % 1 patch (1 patch Transdermal Patch Applied 07/14/19 1137)    ED Course  I have reviewed the triage vital signs and the nursing notes.  Pertinent labs & imaging results that were available during my care of the patient were reviewed by me and considered in my medical decision making (see chart for details).    MDM Rules/Calculators/A&P                      39 year old male presents to the ED for evaluation of left elbow pain x 2 weeks. He also notes  he has been experiencing bilateral arm numbness/tingling every morning for the past few months. Patient denies elbow injury. Patient denies neck pain and neck injury. Vitals all within normal limits. Initial HR at triage was 101, but during my initial exam, heart rate normal at 88. Patient in no acute distress and non-ill appearing. No midline cervical, thoracic, or lumbar tenderness. Full ROM of neck. Suspect numbness/tingling of bilateral upper extremity is due to pinched cervical nerve or bilateral carpal tunnel. Patient does not currently have numbness/ tingling during my evaluation. Tenderness to palpation over left lateral epicondyle. No erythema, warmth, or edema. No crepitus or deformity. Soft compartments. Neurovascularly intact bilaterally. Full ROM of left elbow. Doubt septic arthritis at this time. X-ray personally reviewed which is negative for effusion and bony fractures. Lidoderm patch placed. Ace wrap around left elbow. Orthopedic number given to patient at discharge. Patient advised to use over the counter Voltaren gel, lidoderm patches, ice, and ibuprofen as needed for pain. Patient instructed to follow-up with PCP if symptoms do not improve within the next week. Strict ED precautions discussed with patient. Patient states understanding and agrees to plan. Patient discharged home in no  acute distress and stable vitals  Final Clinical Impression(s) / ED Diagnoses Final diagnoses:  Left elbow pain  Numbness and tingling of both upper extremities    Rx / DC Orders ED Discharge Orders    None       Jesusita Oka 07/14/19 1212    Lorre Nick, MD 07/15/19 [phone]

## 2019-07-14 NOTE — Discharge Instructions (Addendum)
As discussed, your elbow x-ray was normal with no broken bones or fluid. I have included the number of the orthopedic doctor. Call today to schedule an appointment for further evaluation of your numbness/tingling and left elbow pain. You may purchase Voltaren gel and lidoderm patches over the counter as needed for pain. You may take over the counter ibuprofen or tylenol as needed for pain. Follow-up with PCP if symptoms do not improve within the next week. Return to the ER for new or worsening symptoms.

## 2019-07-14 NOTE — ED Triage Notes (Signed)
Pt works at a job with lifting, has some numbness at times in both arms, now has left arm pain at elbow area with he extends or contracts arm. Pain is localized

## 2020-02-25 ENCOUNTER — Emergency Department (HOSPITAL_COMMUNITY): Payer: Self-pay

## 2020-02-25 ENCOUNTER — Emergency Department (HOSPITAL_COMMUNITY)
Admission: EM | Admit: 2020-02-25 | Discharge: 2020-02-25 | Disposition: A | Discharge status: Home / Self Care | Payer: Self-pay | Attending: Emergency Medicine | Admitting: Emergency Medicine

## 2020-02-25 ENCOUNTER — Other Ambulatory Visit: Payer: Self-pay

## 2020-02-25 ENCOUNTER — Encounter (HOSPITAL_COMMUNITY): Payer: Self-pay | Admitting: Emergency Medicine

## 2020-02-25 DIAGNOSIS — J4521 Mild intermittent asthma with (acute) exacerbation: Secondary | ICD-10-CM | POA: Insufficient documentation

## 2020-02-25 DIAGNOSIS — R0981 Nasal congestion: Secondary | ICD-10-CM | POA: Insufficient documentation

## 2020-02-25 DIAGNOSIS — F17210 Nicotine dependence, cigarettes, uncomplicated: Secondary | ICD-10-CM | POA: Insufficient documentation

## 2020-02-25 DIAGNOSIS — Z20822 Contact with and (suspected) exposure to covid-19: Secondary | ICD-10-CM | POA: Insufficient documentation

## 2020-02-25 LAB — SARS CORONAVIRUS 2 BY RT PCR (HOSPITAL ORDER, PERFORMED IN ~~LOC~~ HOSPITAL LAB): SARS Coronavirus 2: NEGATIVE

## 2020-02-25 MED ORDER — ALBUTEROL SULFATE HFA 108 (90 BASE) MCG/ACT IN AERS
1.0000 | INHALATION_SPRAY | RESPIRATORY_TRACT | Status: DC | PRN
  Administered 2020-02-25: 2 via RESPIRATORY_TRACT
  Filled 2020-02-25: qty 6.7

## 2020-02-25 MED ORDER — MUCINEX FAST-MAX 10-650-400 MG/20ML PO LIQD: 20.0000 mL | ORAL | 0 refills | Status: DC | PRN

## 2020-02-25 MED ORDER — AEROCHAMBER Z-STAT PLUS/MEDIUM MISC
1.0000 | Freq: Once | Status: DC
  Filled 2020-02-25: qty 1

## 2020-02-25 NOTE — Discharge Instructions (Addendum)
Continue to avoid cigarette and marijuana smoke.  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  Avoiding smoke will help this from getting worse.

## 2020-02-25 NOTE — ED Triage Notes (Signed)
Patient reports nasal congestion with cough and sputum production since May. Reports stopped smoking cigarettes in May and stopped smoking marijuana today. Denies fever, SOB, chest pain.

## 2020-02-25 NOTE — ED Provider Notes (Signed)
[facility]-EMERGENCY DEPT Provider Note   CSN: [id number] Arrival date & time: 02/25/20  1100     History Chief Complaint  Patient presents with  . Nasal Congestion  . Cough    Daniel Ruiz is a 39 y.o. male.  Pt presents to the ED today with cough and nasal congestion.  He said he's had sx since May.  Pt has not been vaccinated against Covid.  He denies any f/c.  No known Covid exposures.  He quit smoking in May and stopped smoking MJ today.        Past Medical History:  Diagnosis Date  . Irregular heart beat     There are no problems to display for this patient.   Past Surgical History:  Procedure Laterality Date  . ORIF FINGER / THUMB FRACTURE Right        Family History  Problem Relation Age of Onset  . Diabetes Mother     Social History   Tobacco Use  . Smoking status: Current Every Day Smoker    Packs/day: 0.50    Types: Cigarettes  . Smokeless tobacco: Never Used  Vaping Use  . Vaping Use: Never used  Substance Use Topics  . Alcohol use: Yes  . Drug use: Yes    Frequency: 3.0 times per week    Types: Marijuana    Home Medications Prior to Admission medications   Medication Sig Start Date End Date Taking? Authorizing Provider  amoxicillin-clavulanate (AUGMENTIN) 875-125 MG tablet Take 1 tablet by mouth every 12 (twelve) hours. Patient not taking: Reported on 01/13/2019 08/04/18   Glynn Octave, MD  benzonatate (TESSALON) 100 MG capsule Take 1 capsule (100 mg total) by mouth every 8 (eight) hours. Patient not taking: Reported on 01/13/2019 07/21/18   Petrucelli, Lelon Mast R, PA-C  fluticasone (FLONASE) 50 MCG/ACT nasal spray Place 1 spray into both nostrils daily. Patient not taking: Reported on 01/13/2019 07/21/18   Petrucelli, Pleas Koch, PA-C  ibuprofen (ADVIL,MOTRIN) 800 MG tablet Take 1 tablet (800 mg total) by mouth 3 (three) times daily. Patient not taking: Reported on 01/13/2019 07/21/18   Petrucelli, Pleas Koch, PA-C    methocarbamol (ROBAXIN) 750 MG tablet Take 1 tablet (750 mg total) by mouth 4 (four) times daily. Patient not taking: Reported on 01/13/2019 03/21/16   Evangeline Dakin, PA-C  naproxen (NAPROSYN) 500 MG tablet Take 1 tablet (500 mg total) by mouth 2 (two) times daily with a meal. Patient not taking: Reported on 01/13/2019 03/21/16   Evangeline Dakin, PA-C  ondansetron (ZOFRAN-ODT) 8 MG disintegrating tablet Take 1 tablet (8 mg total) by mouth every 8 (eight) hours as needed for nausea or vomiting. Patient not taking: Reported on 01/13/2019 04/14/18   Derwood Kaplan, MD  oxymetazoline (AFRIN NASAL SPRAY) 0.05 % nasal spray Place 1 spray into both nostrils 2 (two) times daily. x3 days then stop Patient not taking: Reported on 01/13/2019 08/04/18   Rancour, Jeannett Senior, MD  Phenylephrine-APAP-guaiFENesin [facility] FAST-MAX) [phone] MG/20ML LIQD Take 20 mLs by mouth every 4 (four) hours as needed (cough). 02/25/20   Jacalyn Lefevre, MD    Allergies    Patient has no known allergies.  Review of Systems   Review of Systems  HENT: Positive for congestion.   Respiratory: Positive for cough.   All other systems reviewed and are negative.   Physical Exam Updated Vital Signs BP (!) 151/98 (BP Location: Right Arm)   Pulse 97   Temp 98.4 F (36.9 C) (Oral)  Resp 18   Ht 6\' 3"  (1.905 m)   Wt 113.4 kg   SpO2 98%   BMI 31.25 kg/m   Physical Exam Vitals and nursing note reviewed.  Constitutional:      Appearance: Normal appearance.  HENT:     Head: Normocephalic and atraumatic.     Right Ear: External ear normal.     Left Ear: External ear normal.     Nose: Nose normal.     Mouth/Throat:     Mouth: Mucous membranes are moist.     Pharynx: Oropharynx is clear.  Eyes:     Extraocular Movements: Extraocular movements intact.     Conjunctiva/sclera: Conjunctivae normal.     Pupils: Pupils are equal, round, and reactive to light.  Cardiovascular:     Rate and Rhythm: Normal rate and regular rhythm.      Pulses: Normal pulses.     Heart sounds: Normal heart sounds.  Pulmonary:     Breath sounds: Wheezing present.  Abdominal:     General: Abdomen is flat. Bowel sounds are normal.     Palpations: Abdomen is soft.  Musculoskeletal:        General: Normal range of motion.     Cervical back: Normal range of motion and neck supple.  Skin:    General: Skin is warm.     Capillary Refill: Capillary refill takes less than 2 seconds.  Neurological:     General: No focal deficit present.     Mental Status: He is alert and oriented to person, place, and time.  Psychiatric:        Mood and Affect: Mood normal.        Behavior: Behavior normal.        Thought Content: Thought content normal.        Judgment: Judgment normal.     ED Results / Procedures / Treatments   Labs (all labs ordered are listed, but only abnormal results are displayed) Labs Reviewed - No data to display  EKG None  Radiology DG Chest 2 View  Result Date: 02/25/2020 CLINICAL DATA:  Cough EXAM: CHEST - 2 VIEW COMPARISON:  08/03/2018 FINDINGS: The heart size and mediastinal contours are within normal limits. Both lungs are clear. Similar hyperlucent appearance of the lung apices. Linear opacities in the right mid and upper lung likely represent scars and appears similar to prior. No pleural effusion. No discernible pneumothorax. The visualized skeletal structures are unremarkable. IMPRESSION: 1. No active cardiopulmonary disease. 2. Similar lucent appearance of the lung apices, suggestive of underlying emphysema and/or blebs. Electronically Signed   By: 08/05/2018 MD   On: 02/25/2020 12:29    Procedures Procedures (including critical care time)  Medications Ordered in ED Medications  albuterol (VENTOLIN HFA) 108 (90 Base) MCG/ACT inhaler 1-2 puff (has no administration in time range)  AeroChamber Plus Flo-Vu Medium MISC 1 each (has no administration in time range)    ED Course  I have reviewed the  triage vital signs and the nursing notes.  Pertinent labs & imaging results that were available during my care of the patient were reviewed by me and considered in my medical decision making (see chart for details).    MDM Rules/Calculators/A&P                          Pt's CXR shows developing emphysema.  Pt is encouraged to avoid smoking tobacco and marijuana.  He is given an  inhaler and mucinex liquid.    Pt is not covid vaccinated.  He is thinking about it.  He is willing to get a Covid swab today.  Pt is not hypoxic and otherwise looks good.  He is stable for d/c.  Return if worse. Final Clinical Impression(s) / ED Diagnoses Final diagnoses:  Mild intermittent reactive airway disease with acute exacerbation    Rx / DC Orders ED Discharge Orders         Ordered    Phenylephrine-APAP-guaiFENesin [facility] FAST-MAX) 10-650-400 MG/20ML LIQD  Every 4 hours PRN        02/25/20 1242           Jacalyn Lefevre, MD 02/25/20 1250

## 2020-03-07 ENCOUNTER — Other Ambulatory Visit: Payer: Self-pay

## 2020-03-07 ENCOUNTER — Encounter (HOSPITAL_COMMUNITY): Payer: Self-pay

## 2020-03-07 ENCOUNTER — Emergency Department (HOSPITAL_COMMUNITY)
Admission: EM | Admit: 2020-03-07 | Discharge: 2020-03-08 | Disposition: A | Discharge status: Home / Self Care | Payer: Self-pay | Attending: Emergency Medicine | Admitting: Emergency Medicine

## 2020-03-07 DIAGNOSIS — R112 Nausea with vomiting, unspecified: Secondary | ICD-10-CM | POA: Insufficient documentation

## 2020-03-07 DIAGNOSIS — Z5321 Procedure and treatment not carried out due to patient leaving prior to being seen by health care provider: Secondary | ICD-10-CM | POA: Insufficient documentation

## 2020-03-07 DIAGNOSIS — R42 Dizziness and giddiness: Secondary | ICD-10-CM | POA: Insufficient documentation

## 2020-03-07 HISTORY — DX: Chronic obstructive pulmonary disease, unspecified: J44.9

## 2020-03-07 LAB — LIPASE, BLOOD: Lipase: 27 U/L (ref 11–51)

## 2020-03-07 LAB — COMPREHENSIVE METABOLIC PANEL
ALT: 55 U/L — ABNORMAL HIGH (ref 0–44)
AST: 29 U/L (ref 15–41)
Albumin: 4.7 g/dL (ref 3.5–5.0)
Alkaline Phosphatase: 54 U/L (ref 38–126)
Anion gap: 12 (ref 5–15)
BUN: 12 mg/dL (ref 6–20)
CO2: 29 mmol/L (ref 22–32)
Calcium: 9.8 mg/dL (ref 8.9–10.3)
Chloride: 101 mmol/L (ref 98–111)
Creatinine, Ser: 1.15 mg/dL (ref 0.61–1.24)
GFR calc Af Amer: >60 mL/min (ref >60)
GFR calc non Af Amer: >60 mL/min (ref >60)
Glucose, Bld: 103 mg/dL — ABNORMAL HIGH (ref 70–99)
Potassium: 4.1 mmol/L (ref 3.5–5.1)
Sodium: 142 mmol/L (ref 135–145)
Total Bilirubin: 1.2 mg/dL (ref 0.3–1.2)
Total Protein: 8.3 g/dL — ABNORMAL HIGH (ref 6.5–8.1)

## 2020-03-07 LAB — URINALYSIS, ROUTINE W REFLEX MICROSCOPIC
Bacteria, UA: NONE SEEN
Bilirubin Urine: NEGATIVE
Glucose, UA: NEGATIVE mg/dL
Ketones, ur: NEGATIVE mg/dL
Leukocytes,Ua: NEGATIVE
Nitrite: NEGATIVE
Protein, ur: NEGATIVE mg/dL
Specific Gravity, Urine: 1.029 (ref 1.005–1.030)
pH: 5 (ref 5.0–8.0)

## 2020-03-07 LAB — CBC
HCT: 45.5 % (ref 39.0–52.0)
Hemoglobin: 15.4 g/dL (ref 13.0–17.0)
MCH: 30.7 pg (ref 26.0–34.0)
MCHC: 33.8 g/dL (ref 30.0–36.0)
MCV: 90.6 fL (ref 80.0–100.0)
Platelets: 248 10*3/uL (ref 150–400)
RBC: 5.02 MIL/uL (ref 4.22–5.81)
RDW: 12.2 % (ref 11.5–15.5)
WBC: 5.1 10*3/uL (ref 4.0–10.5)
nRBC: 0 % (ref 0.0–0.2)

## 2020-03-07 MED ORDER — ONDANSETRON 4 MG PO TBDP
4.0000 mg | ORAL_TABLET | Freq: Once | ORAL | Status: AC | PRN
  Administered 2020-03-07: 4 mg via ORAL
  Filled 2020-03-07: qty 1

## 2020-03-07 NOTE — ED Triage Notes (Addendum)
Patient reports the last 2 days feeling dizzy. When he gets up in the morning it feels like he "put on wet glasses and everything is sideways." He starting throwing up yesterday but today he has thrown up more.   Offered patient Zofran for nausea and vomiting. Patient refused.

## 2020-03-08 NOTE — ED Notes (Signed)
Patient reports he does not want to wait any longer and will go to urgent care tomorrow.

## 2020-07-05 ENCOUNTER — Emergency Department (HOSPITAL_COMMUNITY): Payer: HRSA Program

## 2020-07-05 ENCOUNTER — Emergency Department (HOSPITAL_COMMUNITY)
Admission: EM | Admit: 2020-07-05 | Discharge: 2020-07-05 | Disposition: A | Discharge status: Home / Self Care | Payer: HRSA Program | Attending: Emergency Medicine | Admitting: Emergency Medicine

## 2020-07-05 ENCOUNTER — Encounter (HOSPITAL_COMMUNITY): Payer: Self-pay

## 2020-07-05 DIAGNOSIS — J069 Acute upper respiratory infection, unspecified: Secondary | ICD-10-CM | POA: Diagnosis not present

## 2020-07-05 DIAGNOSIS — U071 COVID-19: Secondary | ICD-10-CM | POA: Insufficient documentation

## 2020-07-05 DIAGNOSIS — R059 Cough, unspecified: Secondary | ICD-10-CM

## 2020-07-05 DIAGNOSIS — Z87891 Personal history of nicotine dependence: Secondary | ICD-10-CM | POA: Insufficient documentation

## 2020-07-05 DIAGNOSIS — J449 Chronic obstructive pulmonary disease, unspecified: Secondary | ICD-10-CM | POA: Diagnosis not present

## 2020-07-05 LAB — RESP PANEL BY RT-PCR (FLU A&B, COVID) ARPGX2
Influenza A by PCR: NEGATIVE
Influenza B by PCR: NEGATIVE
SARS Coronavirus 2 by RT PCR: POSITIVE — AB

## 2020-07-05 MED ORDER — BENZONATATE 100 MG PO CAPS: 100.0000 mg | ORAL_CAPSULE | Freq: Three times a day (TID) | ORAL | 0 refills | Status: DC | PRN

## 2020-07-05 MED ORDER — ALBUTEROL SULFATE HFA 108 (90 BASE) MCG/ACT IN AERS
2.0000 | INHALATION_SPRAY | Freq: Once | RESPIRATORY_TRACT | Status: AC
  Administered 2020-07-05: 2 via RESPIRATORY_TRACT
  Filled 2020-07-05: qty 6.7

## 2020-07-05 NOTE — Discharge Instructions (Signed)
You were seen in the emergency room today with cough and upper respiratory infection symptoms.  Your chest x-ray today did not show pneumonia.  I am testing you for Covid and flu.  The test results will be available in the MyChart app in the coming hours.  Please remain in quarantine until your test results come back.  If you are negative for Covid and flu you should remain isolated from others until you are feeling better for at least 3 days.  I have included a prescription for cough medication and will have you take the albuterol inhaler home with you to use as directed.  Please establish care with a primary care doctor and return to the emergency department any chest pain, shortness of breath, sudden worsening symptoms.

## 2020-07-05 NOTE — ED Provider Notes (Signed)
Emergency Department Provider Note   I have reviewed the triage vital signs and the nursing notes.   HISTORY  Chief Complaint No chief complaint on file.   HPI Daniel Ruiz is a 39 y.o. male with past history of smoking and COPD presents to the emergency department with cough over the past week.  Patient states he since stopped smoking but continues to have cough which is productive at times.  No fevers or chills.  He notes that a coworker tested positive for Covid but he was told by management of that he was not in close contact with this person.  He is not experiencing chest pain or heart palpitations.  No abdominal pain, vomiting, diarrhea.  No radiation of symptoms or other modifying factors. No hemoptysis.    Past Medical History:  Diagnosis Date  . COPD (chronic obstructive pulmonary disease) (HCC)   . Irregular heart beat     There are no problems to display for this patient.   Past Surgical History:  Procedure Laterality Date  . ORIF FINGER / THUMB FRACTURE Right     Allergies Patient has no known allergies.  Family History  Problem Relation Age of Onset  . Diabetes Mother   . Hypertension Mother     Social History Social History   Tobacco Use  . Smoking status: Former Smoker    Packs/day: 0.50    Types: Cigarettes  . Smokeless tobacco: Never Used  Vaping Use  . Vaping Use: Never used  Substance Use Topics  . Alcohol use: Not Currently  . Drug use: Not Currently    Frequency: 3.0 times per week    Types: Marijuana    Review of Systems  Constitutional: No fever/chills. Positive fatigue.  Eyes: No visual changes. ENT: No sore throat. Cardiovascular: Denies chest pain. Respiratory: Denies shortness of breath. Positive cough.  Gastrointestinal: No abdominal pain.  No nausea, no vomiting.  No diarrhea.  No constipation. Genitourinary: Negative for dysuria. Musculoskeletal: Negative for back pain. Skin: Negative for rash. Neurological:  Negative for headaches, focal weakness or numbness.  10-point ROS otherwise negative.  ____________________________________________   PHYSICAL EXAM:  VITAL SIGNS: ED Triage Vitals  Enc Vitals Group     BP 07/05/20 0331 (!) 135/101     Pulse Rate 07/05/20 0331 (!) 107     Resp 07/05/20 0331 15     Temp 07/05/20 0331 98.5 F (36.9 C)     Temp Source 07/05/20 0331 Oral     SpO2 07/05/20 0331 95 %     Weight 07/05/20 0331 240 lb 12.8 oz (109.2 kg)     Height 07/05/20 0331 6\' 2"  (1.88 m)   Constitutional: Alert and oriented. Well appearing and in no acute distress. Eyes: Conjunctivae are normal.  Head: Atraumatic. Nose: No congestion/rhinnorhea. Mouth/Throat: Mucous membranes are moist.   Neck: No stridor.   Cardiovascular: Tachycardia. Good peripheral circulation. Grossly normal heart sounds.   Respiratory: Normal respiratory effort.  No retractions. Lungs CTAB. Gastrointestinal: Soft and nontender. No distention.  Musculoskeletal: No lower extremity tenderness nor edema. No gross deformities of extremities. Neurologic:  Normal speech and language. No gross focal neurologic deficits are appreciated.  Skin:  Skin is warm, dry and intact. No rash noted.  ____________________________________________   LABS (all labs ordered are listed, but only abnormal results are displayed)  Labs Reviewed  RESP PANEL BY RT-PCR (FLU A&B, COVID) ARPGX2 - Abnormal; Notable for the following components:      Result Value  SARS Coronavirus 2 by RT PCR POSITIVE (*)    All other components within normal limits   ____________________________________________  RADIOLOGY  DG Chest Portable 1 View  Result Date: 07/05/2020 CLINICAL DATA:  Cough, chest tightness EXAM: PORTABLE CHEST 1 VIEW COMPARISON:  02/25/2020 FINDINGS: The heart size and mediastinal contours are within normal limits. Both lungs are clear. The visualized skeletal structures are unremarkable. IMPRESSION: No active disease.  Electronically Signed   By: Helyn Numbers MD   On: 07/05/2020 05:07    ____________________________________________   PROCEDURES  Procedure(s) performed:   Procedures  None ____________________________________________   INITIAL IMPRESSION / ASSESSMENT AND PLAN / ED COURSE  Pertinent labs & imaging results that were available during my care of the patient were reviewed by me and considered in my medical decision making (see chart for details).   Patient presents to the emergency department with 1 week of cough which is slightly productive.  Plan for Covid and flu testing and patient will follow these test results in the MyChart app on his phone.  Information provided regarding how to set this up.  Plan for chest x-ray here given 1 week of symptoms.  No hypoxemia or increased work of breathing.  Mild tachycardia on arrival which is improved on my exam.  No significant or severe wheezing but will give an albuterol inhaler as patient symptoms are likely multifactorial even if a viral etiology was ultimately discovered. Exceedingly low suspicion for ACS/PE.   CXR negative. Plan for supportive care at home and close PCP follow up. COVID pending. Patient to f/u with results on the MyChart app.    Daniel Ruiz was evaluated in Emergency Department on 07/05/2020 for the symptoms described in the history of present illness. He was evaluated in the context of the global COVID-19 pandemic, which necessitated consideration that the patient might be at risk for infection with the SARS-CoV-2 virus that causes COVID-19. Institutional protocols and algorithms that pertain to the evaluation of patients at risk for COVID-19 are in a state of rapid change based on information released by regulatory bodies including the CDC and federal and state organizations. These policies and algorithms were followed during the patient's care in the ED.  ____________________________________________  FINAL CLINICAL  IMPRESSION(S) / ED DIAGNOSES  Final diagnoses:  Viral upper respiratory tract infection  Cough     MEDICATIONS GIVEN DURING THIS VISIT:  Medications  albuterol (VENTOLIN HFA) 108 (90 Base) MCG/ACT inhaler 2 puff (2 puffs Inhalation Given 07/05/20 0432)     Note:  This document was prepared using Dragon voice recognition software and may include unintentional dictation errors.  Alona Bene, MD, [facility]    Alette Kataoka, Arlyss Repress, MD 07/05/20 2026

## 2020-07-05 NOTE — ED Triage Notes (Signed)
Pt complains of a dry hacky cough for one week

## 2020-07-08 ENCOUNTER — Encounter (HOSPITAL_COMMUNITY): Payer: Self-pay | Admitting: Emergency Medicine

## 2020-07-08 ENCOUNTER — Other Ambulatory Visit: Payer: Self-pay

## 2020-07-08 ENCOUNTER — Emergency Department (HOSPITAL_COMMUNITY)
Admission: EM | Admit: 2020-07-08 | Discharge: 2020-07-08 | Disposition: A | Discharge status: Home / Self Care | Payer: Self-pay | Attending: Emergency Medicine | Admitting: Emergency Medicine

## 2020-07-08 DIAGNOSIS — Z5321 Procedure and treatment not carried out due to patient leaving prior to being seen by health care provider: Secondary | ICD-10-CM | POA: Insufficient documentation

## 2020-07-08 DIAGNOSIS — R093 Abnormal sputum: Secondary | ICD-10-CM | POA: Insufficient documentation

## 2020-07-08 NOTE — ED Triage Notes (Signed)
Per pt, states was seen here on Tuesday for the same symptoms-tested positive for covid-states this am has some blood tinged sputum-has been taking OTC meds for symptoms

## 2020-07-08 NOTE — ED Notes (Signed)
Pt sts he is going home and will try over the counter meds

## 2021-03-10 ENCOUNTER — Emergency Department (HOSPITAL_COMMUNITY)
Admission: EM | Admit: 2021-03-10 | Discharge: 2021-03-10 | Disposition: A | Discharge status: Home / Self Care | Payer: Self-pay | Attending: Emergency Medicine | Admitting: Emergency Medicine

## 2021-03-10 ENCOUNTER — Other Ambulatory Visit: Payer: Self-pay

## 2021-03-10 ENCOUNTER — Encounter (HOSPITAL_COMMUNITY): Payer: Self-pay

## 2021-03-10 DIAGNOSIS — Z87891 Personal history of nicotine dependence: Secondary | ICD-10-CM | POA: Insufficient documentation

## 2021-03-10 DIAGNOSIS — W57XXXA Bitten or stung by nonvenomous insect and other nonvenomous arthropods, initial encounter: Secondary | ICD-10-CM | POA: Insufficient documentation

## 2021-03-10 DIAGNOSIS — J449 Chronic obstructive pulmonary disease, unspecified: Secondary | ICD-10-CM | POA: Insufficient documentation

## 2021-03-10 DIAGNOSIS — Z7951 Long term (current) use of inhaled steroids: Secondary | ICD-10-CM | POA: Insufficient documentation

## 2021-03-10 DIAGNOSIS — S80861A Insect bite (nonvenomous), right lower leg, initial encounter: Secondary | ICD-10-CM | POA: Insufficient documentation

## 2021-03-10 DIAGNOSIS — L0291 Cutaneous abscess, unspecified: Secondary | ICD-10-CM

## 2021-03-10 MED ORDER — SULFAMETHOXAZOLE-TRIMETHOPRIM 800-160 MG PO TABS: 1.0000 | ORAL_TABLET | Freq: Two times a day (BID) | ORAL | 0 refills | Status: DC

## 2021-03-10 MED ORDER — HYDROCODONE-ACETAMINOPHEN 5-325 MG PO TABS: 2.0000 | ORAL_TABLET | Freq: Four times a day (QID) | ORAL | 0 refills | Status: DC | PRN

## 2021-03-10 NOTE — ED Provider Notes (Signed)
[facility]-EMERGENCY DEPT Provider Note   CSN: [id number] Arrival date & time: 03/10/21  1606     History Chief Complaint  Patient presents with   Insect Bite    Daniel Ruiz is a 40 y.o. male.  Pt complains of a bite on his right leg that has become swollen and drains   The history is provided by the patient. No language interpreter was used.  Leg Pain Time since incident:  2 days Pain details:    Quality:  Aching   Radiates to:  Does not radiate Relieved by:  Nothing Worsened by:  Nothing Ineffective treatments:  None tried Associated symptoms: no fever   Risk factors: no concern for non-accidental trauma       Past Medical History:  Diagnosis Date   COPD (chronic obstructive pulmonary disease) (HCC)    Irregular heart beat     There are no problems to display for this patient.   Past Surgical History:  Procedure Laterality Date   ORIF FINGER / THUMB FRACTURE Right        Family History  Problem Relation Age of Onset   Diabetes Mother    Hypertension Mother     Social History   Tobacco Use   Smoking status: Former    Packs/day: 0.50    Types: Cigarettes   Smokeless tobacco: Never  Vaping Use   Vaping Use: Never used  Substance Use Topics   Alcohol use: Yes   Drug use: Yes    Types: Marijuana    Home Medications Prior to Admission medications   Medication Sig Start Date End Date Taking? Authorizing Provider  HYDROcodone-acetaminophen (NORCO/VICODIN) 5-325 MG tablet Take 2 tablets by mouth every 6 (six) hours as needed for moderate pain. 03/10/21 03/10/22 Yes Cheron Schaumann K, PA-C  sulfamethoxazole-trimethoprim (BACTRIM DS) 800-160 MG tablet Take 1 tablet by mouth 2 (two) times daily. 03/10/21  Yes Cheron Schaumann K, PA-C  amoxicillin-clavulanate (AUGMENTIN) 875-125 MG tablet Take 1 tablet by mouth every 12 (twelve) hours. Patient not taking: Reported on 01/13/2019 08/04/18   Glynn Octave, MD  benzonatate (TESSALON) 100 MG  capsule Take 1 capsule (100 mg total) by mouth 3 (three) times daily as needed for cough. 07/05/20   Long, Arlyss Repress, MD  fluticasone (FLONASE) 50 MCG/ACT nasal spray Place 1 spray into both nostrils daily. Patient not taking: Reported on 01/13/2019 07/21/18   Petrucelli, Pleas Koch, PA-C  ibuprofen (ADVIL,MOTRIN) 800 MG tablet Take 1 tablet (800 mg total) by mouth 3 (three) times daily. Patient not taking: Reported on 01/13/2019 07/21/18   Petrucelli, Pleas Koch, PA-C  methocarbamol (ROBAXIN) 750 MG tablet Take 1 tablet (750 mg total) by mouth 4 (four) times daily. Patient not taking: Reported on 01/13/2019 03/21/16   Evangeline Dakin, PA-C  naproxen (NAPROSYN) 500 MG tablet Take 1 tablet (500 mg total) by mouth 2 (two) times daily with a meal. Patient not taking: Reported on 01/13/2019 03/21/16   Evangeline Dakin, PA-C  ondansetron (ZOFRAN-ODT) 8 MG disintegrating tablet Take 1 tablet (8 mg total) by mouth every 8 (eight) hours as needed for nausea or vomiting. Patient not taking: Reported on 01/13/2019 04/14/18   Derwood Kaplan, MD  oxymetazoline (AFRIN NASAL SPRAY) 0.05 % nasal spray Place 1 spray into both nostrils 2 (two) times daily. x3 days then stop Patient not taking: Reported on 01/13/2019 08/04/18   Rancour, Jeannett Senior, MD  Phenylephrine-APAP-guaiFENesin [facility] FAST-MAX) [phone] MG/20ML LIQD Take 20 mLs by mouth every 4 (four) hours  as needed (cough). 02/25/20   Jacalyn Lefevre, MD    Allergies    Patient has no known allergies.  Review of Systems   Review of Systems  Constitutional:  Negative for fever.  All other systems reviewed and are negative.  Physical Exam Updated Vital Signs BP 130/86 (BP Location: Left Arm)   Pulse (!) 110   Temp 98.9 F (37.2 C) (Oral)   Resp 18   Ht 6\' 3"  (1.905 m)   Wt 108.9 kg   SpO2 96%   BMI 30.00 kg/m   Physical Exam Vitals reviewed.  Constitutional:      Appearance: Normal appearance.  Musculoskeletal:        General: Swelling present.  Skin:     Findings: Erythema present.     Comments: 8cm round area,  draining pustule center,    Neurological:     General: No focal deficit present.     Mental Status: He is alert.  Psychiatric:        Mood and Affect: Mood normal.    ED Results / Procedures / Treatments   Labs (all labs ordered are listed, but only abnormal results are displayed) Labs Reviewed - No data to display  EKG None  Radiology No results found.  Procedures Procedures   Medications Ordered in ED Medications - No data to display  ED Course  I have reviewed the triage vital signs and the nursing notes.  Pertinent labs & imaging results that were available during my care of the patient were reviewed by me and considered in my medical decision making (see chart for details).    MDM Rules/Calculators/A&P                           MDM:  Pt counseled on wound infection.  Pt advised warm mosit compresses  Final Clinical Impression(s) / ED Diagnoses Final diagnoses:  Insect bite of right lower leg, initial encounter  Abscess    Rx / DC Orders ED Discharge Orders          Ordered    sulfamethoxazole-trimethoprim (BACTRIM DS) 800-160 MG tablet  2 times daily        03/10/21 1722    HYDROcodone-acetaminophen (NORCO/VICODIN) 5-325 MG tablet  Every 6 hours PRN        03/10/21 1722          An After Visit Summary was printed and given to the patient.    05/10/21, PA-C 03/10/21 1952    05/10/21, DO 03/10/21 2259

## 2021-03-10 NOTE — Discharge Instructions (Addendum)
Warm compresses 20 minutes 4 times a day  

## 2021-03-10 NOTE — ED Triage Notes (Signed)
Patient reports that he was bit my a bug or something 3 days ago to the right lateral calf. Patient has redness and swelling to the area

## 2021-03-13 ENCOUNTER — Encounter (HOSPITAL_COMMUNITY): Payer: Self-pay | Admitting: Emergency Medicine

## 2021-03-13 ENCOUNTER — Observation Stay (HOSPITAL_COMMUNITY)
Admission: EM | Admit: 2021-03-13 | Discharge: 2021-03-14 | Disposition: A | Discharge status: Home / Self Care | Payer: Self-pay | Attending: Cardiovascular Disease | Admitting: Cardiovascular Disease

## 2021-03-13 ENCOUNTER — Emergency Department (HOSPITAL_COMMUNITY): Payer: Self-pay

## 2021-03-13 DIAGNOSIS — Z79899 Other long term (current) drug therapy: Secondary | ICD-10-CM | POA: Insufficient documentation

## 2021-03-13 DIAGNOSIS — I4891 Unspecified atrial fibrillation: Principal | ICD-10-CM | POA: Insufficient documentation

## 2021-03-13 DIAGNOSIS — Y900 Blood alcohol level of less than 20 mg/100 ml: Secondary | ICD-10-CM | POA: Insufficient documentation

## 2021-03-13 DIAGNOSIS — J449 Chronic obstructive pulmonary disease, unspecified: Secondary | ICD-10-CM | POA: Insufficient documentation

## 2021-03-13 DIAGNOSIS — Z20822 Contact with and (suspected) exposure to covid-19: Secondary | ICD-10-CM | POA: Insufficient documentation

## 2021-03-13 DIAGNOSIS — Z87891 Personal history of nicotine dependence: Secondary | ICD-10-CM | POA: Insufficient documentation

## 2021-03-13 NOTE — ED Triage Notes (Signed)
Pt brought by [facility] from home after exercising/stretching and felt chest pressure and numbness radiating to left arm with SOB and dizziness. Afib w RVR for EMS with HR 223 per EKG. Admin 20mg  cardizem and 10mg  cardizem IV push, HR currently 140-160. Pt says he feels much better but still not quite right.

## 2021-03-13 NOTE — ED Provider Notes (Signed)
[facility] EMERGENCY DEPARTMENT Provider Note   CSN: [id number] Arrival date & time: 03/13/21  2316     History Chief Complaint  Patient presents with   Tachycardia    Daniel Ruiz is a 40 y.o. male.  The history is provided by the patient, medical records and the EMS personnel.   40 y.o. M with hx of irregular heart beat and COPD, presenting to the ED for racing HR.  States he did yoga with his wife this evening and felt ok.  He states he started to get ready for work, bent over to get his pants out of the closet and felt his HR shoot up and got lightheaded/dizzy.  EMS rreported on arrival HR 220's, given total of 30mg  IV cardizem which slowed into 150's.  Patient reports he feels better but not quite back to normal yet.  He denies any recent chest pain.  No known cardiac history.  He does smoke marijuana and had a 40oz beer today.  No hx of thryoid disease.  Denies hx of AFIB but states irregular HR that was diagnosed around 16 which he feels from time to time.  Past Medical History:  Diagnosis Date   COPD (chronic obstructive pulmonary disease) (HCC)    Irregular heart beat     There are no problems to display for this patient.   Past Surgical History:  Procedure Laterality Date   ORIF FINGER / THUMB FRACTURE Right        Family History  Problem Relation Age of Onset   Diabetes Mother    Hypertension Mother     Social History   Tobacco Use   Smoking status: Former    Packs/day: 0.50    Types: Cigarettes   Smokeless tobacco: Never  Vaping Use   Vaping Use: Never used  Substance Use Topics   Alcohol use: Yes   Drug use: Yes    Types: Marijuana    Home Medications Prior to Admission medications   Medication Sig Start Date End Date Taking? Authorizing Provider  amoxicillin-clavulanate (AUGMENTIN) 875-125 MG tablet Take 1 tablet by mouth every 12 (twelve) hours. Patient not taking: Reported on 01/13/2019 08/04/18   08/06/18, MD   benzonatate (TESSALON) 100 MG capsule Take 1 capsule (100 mg total) by mouth 3 (three) times daily as needed for cough. 07/05/20   Long, 07/07/20, MD  fluticasone (FLONASE) 50 MCG/ACT nasal spray Place 1 spray into both nostrils daily. Patient not taking: Reported on 01/13/2019 07/21/18   Petrucelli, 07/23/18, PA-C  HYDROcodone-acetaminophen (NORCO/VICODIN) 5-325 MG tablet Take 2 tablets by mouth every 6 (six) hours as needed for moderate pain. 03/10/21 03/10/22  05/10/22, PA-C  ibuprofen (ADVIL,MOTRIN) 800 MG tablet Take 1 tablet (800 mg total) by mouth 3 (three) times daily. Patient not taking: Reported on 01/13/2019 07/21/18   Petrucelli, 07/23/18, PA-C  methocarbamol (ROBAXIN) 750 MG tablet Take 1 tablet (750 mg total) by mouth 4 (four) times daily. Patient not taking: Reported on 01/13/2019 03/21/16   03/23/16, PA-C  naproxen (NAPROSYN) 500 MG tablet Take 1 tablet (500 mg total) by mouth 2 (two) times daily with a meal. Patient not taking: Reported on 01/13/2019 03/21/16   03/23/16, PA-C  ondansetron (ZOFRAN-ODT) 8 MG disintegrating tablet Take 1 tablet (8 mg total) by mouth every 8 (eight) hours as needed for nausea or vomiting. Patient not taking: Reported on 01/13/2019 04/14/18   06/14/18, MD  oxymetazoline Derwood Kaplan NASAL  SPRAY) 0.05 % nasal spray Place 1 spray into both nostrils 2 (two) times daily. x3 days then stop Patient not taking: Reported on 01/13/2019 08/04/18   Rancour, Jeannett Senior, MD  Phenylephrine-APAP-guaiFENesin [facility] FAST-MAX) [phone] MG/20ML LIQD Take 20 mLs by mouth every 4 (four) hours as needed (cough). 02/25/20   Jacalyn Lefevre, MD  sulfamethoxazole-trimethoprim (BACTRIM DS) 800-160 MG tablet Take 1 tablet by mouth 2 (two) times daily. 03/10/21   Elson Areas, PA-C    Allergies    Patient has no known allergies.  Review of Systems   Review of Systems  Cardiovascular:  Positive for palpitations.  All other systems reviewed and are  negative.  Physical Exam Updated Vital Signs BP 103/85 (BP Location: Right Arm)   Pulse 82   Temp 98.5 F (36.9 C) (Oral)   Resp (!) 27   Ht 6\' 3"  (1.905 m)   Wt 117.9 kg   SpO2 100%   BMI 32.50 kg/m   Physical Exam Vitals and nursing note reviewed.  Constitutional:      Appearance: He is well-developed.  HENT:     Head: Normocephalic and atraumatic.  Eyes:     Conjunctiva/sclera: Conjunctivae normal.     Pupils: Pupils are equal, round, and reactive to light.  Cardiovascular:     Rate and Rhythm: Tachycardia present. Rhythm irregularly irregular.     Heart sounds: Normal heart sounds.     Comments: AFIB 140's-150's during exam Pulmonary:     Effort: No respiratory distress.     Breath sounds: Normal breath sounds. No rhonchi.  Abdominal:     General: Bowel sounds are normal.     Palpations: Abdomen is soft.  Musculoskeletal:        General: Normal range of motion.     Cervical back: Normal range of motion.  Skin:    General: Skin is warm and dry.  Neurological:     Mental Status: He is alert and oriented to person, place, and time.    ED Results / Procedures / Treatments   Labs (all labs ordered are listed, but only abnormal results are displayed) Labs Reviewed  BASIC METABOLIC PANEL - Abnormal; Notable for the following components:      Result Value   Glucose, Bld 101 (*)    All other components within normal limits  RESP PANEL BY RT-PCR (FLU A&B, COVID) ARPGX2  CBC WITH DIFFERENTIAL/PLATELET  TSH  T4, FREE  ETHANOL  RAPID URINE DRUG SCREEN, HOSP PERFORMED  HIV ANTIBODY (ROUTINE TESTING W REFLEX)  BRAIN NATRIURETIC PEPTIDE  TROPONIN I (HIGH SENSITIVITY)    EKG EKG Interpretation  Date/Time:  Monday March 13 2021 23:22:31 EDT Ventricular Rate:  151 PR Interval:    QRS Duration: 76 QT Interval:  288 QTC Calculation: 457 R Axis:   57 Text Interpretation: Atrial fibrillation with RVR Confirmed by [birth year]1990 [phone]) on 03/14/2021 3:58:25  AM  Radiology DG Chest Port 1 View  Result Date: 03/13/2021 CLINICAL DATA:  Atrial fibrillation. EXAM: PORTABLE CHEST 1 VIEW COMPARISON:  07/05/2020 FINDINGS: Normal heart size for portable technique.The cardiomediastinal contours are normal. The lungs are clear. Pulmonary vasculature is normal. No consolidation, pleural effusion, or pneumothorax. No acute osseous abnormalities are seen. IMPRESSION: No acute chest findings. Electronically Signed   By: 07/07/2020 M.D.   On: 03/13/2021 23:55    Procedures Procedures   CRITICAL CARE Performed by: 05/13/2021   Total critical care time: 45 minutes  Critical care time was exclusive of  separately billable procedures and treating other patients.  Critical care was necessary to treat or prevent imminent or life-threatening deterioration.  Critical care was time spent personally by me on the following activities: development of treatment plan with patient and/or surrogate as well as nursing, discussions with consultants, evaluation of patient's response to treatment, examination of patient, obtaining history from patient or surrogate, ordering and performing treatments and interventions, ordering and review of laboratory studies, ordering and review of radiographic studies, pulse oximetry and re-evaluation of patient's condition.   This patients CHA2DS2-VASc Score and unadjusted Ischemic Stroke Rate (% per year) is equal to 0.2 % stroke rate/year from a score of 0  Above score calculated as 1 point each if present [CHF, HTN, DM, Vascular=MI/PAD/Aortic Plaque, Age if 65-74, or Male] Above score calculated as 2 points each if present [Age > 75, or Stroke/TIA/TE]   Medications  diltiazem (CARDIZEM) 125 mg in dextrose 5% 125 mL (1 mg/mL) infusion (15 mg/hr Intravenous Rate/Dose Change 03/14/21 0324)  apixaban (ELIQUIS) tablet 5 mg (5 mg Oral Given 03/14/21 0414)  sodium chloride 0.9 % bolus 1,000 mL (0 mLs Intravenous Stopped 03/14/21 0144)     ED Course  I have reviewed the triage vital signs and the nursing notes.  Pertinent labs & imaging results that were available during my care of the patient were reviewed by me and considered in my medical decision making (see chart for details).    MDM Rules/Calculators/A&P                           40 y.o. M here with AFIB w/RVR.  Reports history of irregular heartbeat in the past which he feels from time to time but has never been told this was A. fib.  Rate initially 220s per EMS, improved to around 160s after 30 mg bolus of Cardizem.  States he feels better at present but still not back to baseline.  He denies any chest pain does admit to drinking 40 ounce beer daily and occasionally smoking marijuana.  Denies any excessive caffeine or energy drink use.  No history of underlying thyroid disease.  Labs and chest x-ray pending.  Considered cardioversion, however as he does report history of irregular heartbeat in the past and feels this from time to time, cannot entirely ensure exact time of onset. We will continue IV Cardizem for now.  Labs are overall reassuring.  Normal thyroid testing.  Chest x-ray is clear.  Cardizem has steadily been increased, now up to rate of 15 but HR still 150's-160's.  Patient states he is not feeling palpitations at present.  BP soft around 100 systolic.  Will discuss with cardiology.  4:01 AM Discussed with cardiology, Dr. Julianne Handler-- Continue cardizem for now at current rate as long as BP allows, give dose of eliquis.  May need cardioversion in AM.  He will evaluate in the ED and admit.    Patient has been updated and is agreeable with care plan.  Final Clinical Impression(s) / ED Diagnoses Final diagnoses:  Atrial fibrillation, unspecified type [facility])    Rx / DC Orders ED Discharge Orders     None        Garlon Hatchet, PA-C 03/14/21 0523    Nira Conn, MD 03/14/21 [phone]

## 2021-03-14 DIAGNOSIS — I4891 Unspecified atrial fibrillation: Secondary | ICD-10-CM

## 2021-03-14 LAB — CBC WITH DIFFERENTIAL/PLATELET
Abs Immature Granulocytes: 0.02 10*3/uL (ref 0.00–0.07)
Basophils Absolute: 0 10*3/uL (ref 0.0–0.1)
Basophils Relative: 1 %
Eosinophils Absolute: 0.1 10*3/uL (ref 0.0–0.5)
Eosinophils Relative: 3 %
HCT: 41.2 % (ref 39.0–52.0)
Hemoglobin: 13.5 g/dL (ref 13.0–17.0)
Immature Granulocytes: 1 %
Lymphocytes Relative: 41 %
Lymphs Abs: 1.7 10*3/uL (ref 0.7–4.0)
MCH: 30.5 pg (ref 26.0–34.0)
MCHC: 32.8 g/dL (ref 30.0–36.0)
MCV: 93 fL (ref 80.0–100.0)
Monocytes Absolute: 0.5 10*3/uL (ref 0.1–1.0)
Monocytes Relative: 11 %
Neutro Abs: 1.9 10*3/uL (ref 1.7–7.7)
Neutrophils Relative %: 43 %
Platelets: 243 10*3/uL (ref 150–400)
RBC: 4.43 MIL/uL (ref 4.22–5.81)
RDW: 12.2 % (ref 11.5–15.5)
WBC: 4.3 10*3/uL (ref 4.0–10.5)
nRBC: 0 % (ref 0.0–0.2)

## 2021-03-14 LAB — RAPID URINE DRUG SCREEN, HOSP PERFORMED
Amphetamines: NOT DETECTED
Barbiturates: NOT DETECTED
Benzodiazepines: NOT DETECTED
Cocaine: NOT DETECTED
Opiates: NOT DETECTED
Tetrahydrocannabinol: NOT DETECTED

## 2021-03-14 LAB — BASIC METABOLIC PANEL
Anion gap: 7 (ref 5–15)
BUN: 13 mg/dL (ref 6–20)
CO2: 24 mmol/L (ref 22–32)
Calcium: 8.9 mg/dL (ref 8.9–10.3)
Chloride: 106 mmol/L (ref 98–111)
Creatinine, Ser: 1.03 mg/dL (ref 0.61–1.24)
GFR, Estimated: >60 mL/min (ref >60)
Glucose, Bld: 101 mg/dL — ABNORMAL HIGH (ref 70–99)
Potassium: 3.5 mmol/L (ref 3.5–5.1)
Sodium: 137 mmol/L (ref 135–145)

## 2021-03-14 LAB — TROPONIN I (HIGH SENSITIVITY): Troponin I (High Sensitivity): 4 ng/L (ref <18)

## 2021-03-14 LAB — RESP PANEL BY RT-PCR (FLU A&B, COVID) ARPGX2
Influenza A by PCR: NEGATIVE
Influenza B by PCR: NEGATIVE
SARS Coronavirus 2 by RT PCR: NEGATIVE

## 2021-03-14 LAB — TSH: TSH: 3.684 u[IU]/mL (ref 0.350–4.500)

## 2021-03-14 LAB — BRAIN NATRIURETIC PEPTIDE: B Natriuretic Peptide: 67.7 pg/mL (ref 0.0–100.0)

## 2021-03-14 LAB — T4, FREE: Free T4: 0.85 ng/dL (ref 0.61–1.12)

## 2021-03-14 LAB — ETHANOL: Alcohol, Ethyl (B): <10 mg/dL (ref <10)

## 2021-03-14 LAB — HIV ANTIBODY (ROUTINE TESTING W REFLEX): HIV Screen 4th Generation wRfx: NONREACTIVE

## 2021-03-14 MED ORDER — SULFAMETHOXAZOLE-TRIMETHOPRIM 800-160 MG PO TABS
1.0000 | ORAL_TABLET | Freq: Two times a day (BID) | ORAL | Status: DC
  Administered 2021-03-14: 1 via ORAL
  Filled 2021-03-14: qty 1

## 2021-03-14 MED ORDER — DILTIAZEM HCL ER COATED BEADS 240 MG PO CP24
240.0000 mg | ORAL_CAPSULE | Freq: Every day | ORAL | Status: DC
  Administered 2021-03-14: 240 mg via ORAL
  Filled 2021-03-14: qty 1

## 2021-03-14 MED ORDER — APIXABAN 5 MG PO TABS
5.0000 mg | ORAL_TABLET | Freq: Two times a day (BID) | ORAL | Status: DC
  Administered 2021-03-14: 5 mg via ORAL
  Filled 2021-03-14 (×2): qty 1

## 2021-03-14 MED ORDER — ACETAMINOPHEN 325 MG PO TABS: 650.0000 mg | ORAL_TABLET | ORAL | Status: DC | PRN

## 2021-03-14 MED ORDER — DILTIAZEM HCL ER COATED BEADS 240 MG PO CP24: 240.0000 mg | ORAL_CAPSULE | Freq: Every day | ORAL | 2 refills | Status: DC

## 2021-03-14 MED ORDER — METOPROLOL TARTRATE 25 MG PO TABS: 25.0000 mg | ORAL_TABLET | Freq: Two times a day (BID) | ORAL | 2 refills | Status: DC

## 2021-03-14 MED ORDER — DILTIAZEM HCL-DEXTROSE 125-5 MG/125ML-% IV SOLN (PREMIX)
5.0000 mg/h | INTRAVENOUS | Status: DC
  Administered 2021-03-14: 15 mg/h via INTRAVENOUS
  Administered 2021-03-14: 5 mg/h via INTRAVENOUS
  Filled 2021-03-14 (×2): qty 125

## 2021-03-14 MED ORDER — ONDANSETRON HCL 4 MG/2ML IJ SOLN: 4.0000 mg | Freq: Four times a day (QID) | INTRAMUSCULAR | Status: DC | PRN

## 2021-03-14 MED ORDER — SODIUM CHLORIDE 0.9 % IV BOLUS
1000.0000 mL | Freq: Once | INTRAVENOUS | Status: AC
  Administered 2021-03-14: 1000 mL via INTRAVENOUS

## 2021-03-14 MED ORDER — METOPROLOL TARTRATE 25 MG PO TABS
25.0000 mg | ORAL_TABLET | Freq: Two times a day (BID) | ORAL | Status: DC
  Administered 2021-03-14: 25 mg via ORAL
  Filled 2021-03-14: qty 1

## 2021-03-14 MED ORDER — METOPROLOL TARTRATE 5 MG/5ML IV SOLN: 5.0000 mg | Freq: Four times a day (QID) | INTRAVENOUS | Status: DC | PRN

## 2021-03-14 NOTE — ED Notes (Signed)
Pt care taken, hr is still 160's, resting, no chest pain

## 2021-03-14 NOTE — ED Notes (Signed)
Notified provider of pt's continued elevated HR

## 2021-03-14 NOTE — Discharge Summary (Addendum)
Discharge Summary    Patient ID: Daniel Ruiz MRN: [id number]; DOB: November 06, 1980  Admit date: 03/13/2021 Discharge date: 03/14/2021  PCP:  Patient, No Pcp Per (Inactive)   [facility] Providers Cardiologist:  Donato Schultz, MD   {  Discharge Diagnoses    Active Problems:   Atrial fibrillation with rapid ventricular response [facility])  Diagnostic Studies/Procedures    None   History of Present Illness     Daniel Ruiz is a 40 y.o. male with history of COPD, marijuana use, etOH use who was seen 03/14/2021 for the evaluation of AF.  Hospital Course     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 He was in his usual state of health prior to presentation and did some yoga with his wife. He realized after getting in bed that he'd forgotten to put out his work clothes and lept up to his closet where he felt light headed with his heart racing.  His wife felt his pulse and confirmed this and he was brought to the ED via EMS where he received diltiazem en route and was started on diltiazem infusion at 15 mg/hr.  He denied prior severe episode however did report having occasional palpitations which have been of increased frequency the last few weeks.  He had palptiations in his teens and wore a holter monitor without diagnosed arrhythmia.   No history of heart failure, diabetes, prior stroke, he does not smoke tobacco, and he doesn't take meds for hypertension.   He was recently started on bactrim for an abscess after a bug bite but this site has been improving.  No ongoing fevers.   He continued to have elevated rates despite full dose IV diltiazem. Metoprolol 25mg  BID was added to his regimen. While still in the ED, he sneezed and self converted to NSR with stable rates. Plan at this time will be metoprolol 25mg  BID along with Diltiazem CD  240mg  QD. We will arrange PO follow up in 4 weeks with AF Clinic. He does not require anticoagulation due to low CHA2DS2VASc score of 0.   Other medical concerns:   -Bug bite: Continue bactrim DS BID course, day 4/10  -Cannabis use: Discussed use reduction   -ETOH use: Discussed use reduction   Consultants: None    The patient was seen and examined by Dr. who feels that he is stable and ready for discharge today, 03/14/21. I will provide work notes for the patient and wife.   Did the patient have an acute coronary syndrome (MI, NSTEMI, STEMI, etc) this admission?:  No                               Did the patient have a percutaneous coronary intervention (stent / angioplasty)?:  No.   ____________  Discharge Vitals Blood pressure 133/67, pulse 74, temperature 98.5 F (36.9 C), temperature source Oral, resp. rate 18, height 6\' 3"  (1.905 m), weight 117.9 kg, SpO2 99 %.  Filed Weights   03/13/21 2328  Weight: 117.9 kg   Labs & Radiologic Studies    CBC Recent Labs    03/14/21 0021  WBC 4.3  NEUTROABS 1.9  HGB 13.5  HCT 41.2  MCV 93.0  PLT 243   Basic Metabolic Panel Recent Labs    05/14/21  0021  NA 137  K 3.5  CL 106  CO2 24  GLUCOSE 101*  BUN 13  CREATININE 1.03  CALCIUM 8.9   Liver Function Tests No results for input(s): AST, ALT, ALKPHOS, BILITOT, PROT, ALBUMIN in the last 72 hours. No results for input(s): LIPASE, AMYLASE in the last 72 hours. High Sensitivity Troponin:   Recent Labs  Lab 03/14/21 0021  TROPONINIHS 4    BNP Invalid input(s): POCBNP D-Dimer No results for input(s): DDIMER in the last 72 hours. Hemoglobin A1C No results for input(s): HGBA1C in the last 72 hours. Fasting Lipid Panel No results for input(s): CHOL, HDL, LDLCALC, TRIG, CHOLHDL, LDLDIRECT in the last 72 hours. Thyroid Function Tests Recent Labs    03/14/21 0021  TSH 3.684   _____________  DG Chest Port 1 View  Result Date: 03/13/2021 CLINICAL DATA:  Atrial  fibrillation. EXAM: PORTABLE CHEST 1 VIEW COMPARISON:  07/05/2020 FINDINGS: Normal heart size for portable technique.The cardiomediastinal contours are normal. The lungs are clear. Pulmonary vasculature is normal. No consolidation, pleural effusion, or pneumothorax. No acute osseous abnormalities are seen. IMPRESSION: No acute chest findings. Electronically Signed   By: Narda Rutherford M.D.   On: 03/13/2021 23:55   Disposition   Pt is being discharged home today in good condition.  Follow-up Plans & Appointments     Follow-up Information     Fenton, Levonne Spiller R, PA Follow up on 04/11/2021.   Specialty: Cardiology Why: at 1030am Contact information: [address] [place] [address] [phone]                Discharge Medications   Allergies as of 03/14/2021   No Known Allergies      Medication List     STOP taking these medications    aspirin EC 81 MG tablet       TAKE these medications    benzonatate 100 MG capsule Commonly known as: TESSALON Take 1 capsule (100 mg total) by mouth 3 (three) times daily as needed for cough.   diltiazem 240 MG 24 hr capsule Commonly known as: CARDIZEM CD Take 1 capsule (240 mg total) by mouth daily.   fluticasone 50 MCG/ACT nasal spray Commonly known as: FLONASE Place 1 spray into both nostrils daily.   HYDROcodone-acetaminophen 5-325 MG tablet Commonly known as: NORCO/VICODIN Take 2 tablets by mouth every 6 (six) hours as needed for moderate pain.   ibuprofen 800 MG tablet Commonly known as: ADVIL Take 1 tablet (800 mg total) by mouth 3 (three) times daily.   methocarbamol 750 MG tablet Commonly known as: ROBAXIN Take 1 tablet (750 mg total) by mouth 4 (four) times daily.   metoprolol tartrate 25 MG tablet Commonly known as: LOPRESSOR Take 1 tablet (25 mg total) by mouth 2 (two) times daily.   Mucinex Fast-Max 10-650-400 MG/20ML Liqd Generic drug: Phenylephrine-APAP-guaiFENesin Take 20 mLs by mouth every 4  (four) hours as needed (cough).   naproxen 500 MG tablet Commonly known as: Naprosyn Take 1 tablet (500 mg total) by mouth 2 (two) times daily with a meal.   ondansetron 8 MG disintegrating tablet Commonly known as: Zofran ODT Take 1 tablet (8 mg total) by mouth every 8 (eight) hours as needed for nausea or vomiting.   oxymetazoline 0.05 % nasal spray Commonly known as: Afrin Nasal Spray Place 1 spray into both nostrils 2 (two) times daily. x3 days then stop   sulfamethoxazole-trimethoprim 800-160 MG tablet Commonly known as: BACTRIM DS Take 1 tablet by  mouth 2 (two) times daily. What changed:  when to take this additional instructions         Outstanding Labs/Studies   None   Duration of Discharge Encounter   Greater than 30 minutes including physician time.  Signed, Georgie Chard, NP 03/14/2021, 2:24 PM  ADDENDUM:Sidonie Dexheimer Anne Fu, MD Personally seen and examined. Agree with above.     Converted after sneeze.  No significant pause.  CHADSV2-0   OK not to use anticoagulation Was on diltiazem 15/hr prior to conversion   Will allow for DC home Diltiazem CD 240 mg PO QD.    Close follow up in afib clinic.    Donato Schultz, MD

## 2021-03-14 NOTE — H&P (Addendum)
Cardiology Admission History and Physical:   Patient ID: Daniel Ruiz MRN: [id number]; DOB: 01/30/1981   Admission date: 03/13/2021  PCP:  Patient, No Pcp Per (Inactive)   [facility] Providers Cardiologist:  None        Chief Complaint:  palpitations  Patient Profile:   Daniel Ruiz is a 40 y.o. male with history of COPD, marijuana use, etOH use who is being seen 03/14/2021 for the evaluation of AF.  History of Present Illness:   Mr. Daniel Ruiz is a 40 year old male with history of palpitations, daily marijuana use, daily etOH use (at least 1 40 oz malt liquor), and reported COPD (no objective data) presenting with palpitations.  He was in his usual state of health today and did some yoga with his wife.  He realized after getting in bed that he'd forgotten to put out his work clothes and leapt up to his closet where he felt light headed and like his heart was racing.  His wife felt his pulse and confirmed this and he was brought to the ED via EMS, received diltiazem en route and started on diltiazem infusion currently at 15 mg/hr.  He hasn't had a severe episode like this but he does have occasional palpitations which have been of increased frequency the last few weeks.  He had palptiations in his teens and wore a holter monitor without diagnosed arrhythmia.  No history of heart failure, diabetes, prior stroke, he does not smoke tobacco, and he doesn't take meds for hypertension.  He was recently started on bactrim for an abscess after a bug bite but this site has been improving.  No ongoing fevers.   Past Medical History:  Diagnosis Date   COPD (chronic obstructive pulmonary disease) (HCC)    Irregular heart beat     Past Surgical History:  Procedure Laterality Date   ORIF FINGER / THUMB FRACTURE Right      Medications Prior to Admission: Prior to Admission medications   Medication Sig Start Date End Date Taking? Authorizing Provider  aspirin EC 81 MG tablet Take 324 mg by  mouth once. Swallow whole.   Yes [provider]  sulfamethoxazole-trimethoprim (BACTRIM DS) 800-160 MG tablet Take 1 tablet by mouth 2 (two) times daily. Patient taking differently: Take 1 tablet by mouth See admin instructions. Bid x 10 days 03/10/21  Yes Elson AreasSofia, Leslie K, PA-C  benzonatate (TESSALON) 100 MG capsule Take 1 capsule (100 mg total) by mouth 3 (three) times daily as needed for cough. Patient not taking: No sig reported 07/05/20   Long, Arlyss RepressJoshua G, MD  fluticasone [facility](FLONASE) 50 MCG/ACT nasal spray Place 1 spray into both nostrils daily. Patient not taking: No sig reported 07/21/18   Petrucelli, Samantha R, PA-C  HYDROcodone-acetaminophen (NORCO/VICODIN) 5-325 MG tablet Take 2 tablets by mouth every 6 (six) hours as needed for moderate pain. 03/10/21 03/10/22  Elson AreasSofia, Leslie K, PA-C  ibuprofen (ADVIL,MOTRIN) 800 MG tablet Take 1 tablet (800 mg total) by mouth 3 (three) times daily. Patient not taking: No sig reported 07/21/18   Petrucelli, Samantha R, PA-C  methocarbamol (ROBAXIN) 750 MG tablet Take 1 tablet (750 mg total) by mouth 4 (four) times daily. Patient not taking: No sig reported 03/21/16   Beers, Charmayne Sheerharles M, PA-C  naproxen (NAPROSYN) 500 MG tablet Take 1 tablet (500 mg total) by mouth 2 (two) times daily with a meal. Patient not taking: No sig reported 03/21/16   Evangeline DakinBeers, Charles M, PA-C  ondansetron (ZOFRAN-ODT) 8 MG disintegrating tablet  Take 1 tablet (8 mg total) by mouth every 8 (eight) hours as needed for nausea or vomiting. Patient not taking: No sig reported 04/14/18   Derwood Kaplan, MD  oxymetazoline (AFRIN NASAL SPRAY) 0.05 % nasal spray Place 1 spray into both nostrils 2 (two) times daily. x3 days then stop Patient not taking: No sig reported 08/04/18   Rancour, Jeannett Senior, MD  Phenylephrine-APAP-guaiFENesin [facility] FAST-MAX) [id number] MG/20ML LIQD Take 20 mLs by mouth every 4 (four) hours as needed (cough). Patient not taking: No sig reported 02/25/20   Jacalyn Lefevre, MD      Allergies:   No Known Allergies  Social History:   Social History   Socioeconomic History   Marital status: Married    Spouse name: Not on file   Number of children: Not on file   Years of education: Not on file   Highest education level: Not on file  Occupational History   Not on file  Tobacco Use   Smoking status: Former    Packs/day: 0.50    Types: Cigarettes   Smokeless tobacco: Never  Vaping Use   Vaping Use: Never used  Substance and Sexual Activity   Alcohol use: Yes   Drug use: Yes    Types: Marijuana   Sexual activity: Yes    Birth control/protection: None  Other Topics Concern   Not on file  Social History Narrative   Not on file   Social Determinants of Health   Financial Resource Strain: Not on file  Food Insecurity: Not on file  Transportation Needs: Not on file  Physical Activity: Not on file  Stress: Not on file  Social Connections: Not on file  Intimate Partner Violence: Not on file    Family History:  cousins on mom side with MIs otherwise no premature CAD The patient's family history includes Diabetes in his mother; Hypertension in his mother.    ROS:  Please see the history of present illness.  All other ROS reviewed and negative.     Physical Exam/Data:   Vitals:   03/14/21 0300 03/14/21 0315 03/14/21 0330 03/14/21 0400  BP: 122/70 112/73 99/84 110/85  Pulse: 77 89 81 (!) 57  Resp:  18 (!) 30 (!) 22  Temp:      TempSrc:      SpO2: 98% 97% 97% 98%  Weight:      Height:        Intake/Output Summary (Last 24 hours) at 03/14/2021 0524 Last data filed at 03/14/2021 0144 Gross per 24 hour  Intake 1700 ml  Output --  Net 1700 ml   Last 3 Weights 03/13/2021 03/10/2021 07/05/2020  Weight (lbs) 260 lb 240 lb 240 lb 12.8 oz  Weight (kg) 117.935 kg 108.863 kg 109.226 kg     Body mass index is 32.5 kg/m.  General:  Well nourished, well developed, in no acute distress HEENT: normal Lymph: no adenopathy Neck: no JVD Endocrine:  No  thryomegaly Vascular: No carotid bruits; FA pulses 2+ bilaterally without bruits  Cardiac:  normal S1, S2; RRR; no murmur  Lungs:  clear to auscultation bilaterally, no wheezing, rhonchi or rales  Abd: soft, nontender, no hepatomegaly  Ext: no edema, RLE abscess some fluctuance, improving per patient Musculoskeletal:  No deformities, BUE and BLE strength normal and equal Skin: warm and dry  Neuro:  CNs 2-12 intact, no focal abnormalities noted Psych:  Normal affect    EKG:  The ECG that was done today was personally reviewed and demonstrates atrial  fibrillation with rapid ventricualr response  Relevant CV Studies: none  Laboratory Data:  High Sensitivity Troponin:   Recent Labs  Lab 03/14/21 0021  TROPONINIHS 4      Chemistry Recent Labs  Lab 03/14/21 0021  NA 137  K 3.5  CL 106  CO2 24  GLUCOSE 101*  BUN 13  CREATININE 1.03  CALCIUM 8.9  GFRNONAA >60  ANIONGAP 7    No results for input(s): PROT, ALBUMIN, AST, ALT, ALKPHOS, BILITOT in the last 168 hours. Hematology Recent Labs  Lab 03/14/21 0021  WBC 4.3  RBC 4.43  HGB 13.5  HCT 41.2  MCV 93.0  MCH 30.5  MCHC 32.8  RDW 12.2  PLT 243   BNPNo results for input(s): BNP, PROBNP in the last 168 hours.  DDimer No results for input(s): DDIMER in the last 168 hours.   Radiology/Studies:  DG Chest Port 1 View  Result Date: 03/13/2021 CLINICAL DATA:  Atrial fibrillation. EXAM: PORTABLE CHEST 1 VIEW COMPARISON:  07/05/2020 FINDINGS: Normal heart size for portable technique.The cardiomediastinal contours are normal. The lungs are clear. Pulmonary vasculature is normal. No consolidation, pleural effusion, or pneumothorax. No acute osseous abnormalities are seen. IMPRESSION: No acute chest findings. Electronically Signed   By: Narda Rutherford M.D.   On: 03/13/2021 23:55     Assessment and Plan:   40 year old male, C2V score 0, presenting with what is apparently lone AF, though limited workup to date.  He is not  particularly symptomatic with rate control to the 150s on diltiazem infusion and has had episodic palpitations so there is concern for asymptomatic AF and safety of cardioversion in ED.  I agree and discussed TEE/DCCV with him and he is amenable.  In terms of risk factors, discussed contribution of etOH, regular cannabis use, and possible sleep apnea (confirms restlesness, snoring, frequent awakenings).  Problem list #AF with RVR, C2V 0 - Eliquis 5 mg BID now and at least three weeks after cardivoersion - TSH OK - TTE today - NPO for TEE/DCCV - Continue diltiazem infusion for rate control; transition to oral at discharge - AF clinic referral - Lipids, A1C - Outpatient sleep study  #Bug bite - Continue bactrim DS BID course, day 4/10  #Cannabis use - Discussed use reduction  #etOH use - Discussed use reduction  Risk Assessment/Risk Scores:         CHA2DS2-VASc Score =     This indicates a  % annual risk of stroke. The patient's score is based upon:        Severity of Illness: The appropriate patient status for this patient is OBSERVATION. Observation status is judged to be reasonable and necessary in order to provide the required intensity of service to ensure the patient's safety. The patient's presenting symptoms, physical exam findings, and initial radiographic and laboratory data in the context of their medical condition is felt to place them at decreased risk for further clinical deterioration. Furthermore, it is anticipated that the patient will be medically stable for discharge from the hospital within 2 midnights of admission. The following factors support the patient status of observation.   " The patient's presenting symptoms include palpitations, chest discomfort. " The physical exam findings include AF with RVR. " The initial radiographic and laboratory data are AF.   For questions or updates, please contact [facility] Please consult [email or web address] for contact  info under     Signed, Regino Schultze, MD  03/14/2021 5:24 AM   ADDENDUM:  Converted after sneeze.  No significant pause.  CHADSV2-0  OK not to use anticoagulation Was on diltiazem 15/hr prior to conversion  Will allow for DC home Diltiazem CD 240 mg PO QD.   Close follow up in afib clinic.   Donato Schultz, MD

## 2021-03-14 NOTE — ED Notes (Signed)
Bed placement notified pt is being d/c

## 2021-03-24 ENCOUNTER — Encounter (HOSPITAL_COMMUNITY): Payer: Self-pay

## 2021-03-24 ENCOUNTER — Emergency Department (HOSPITAL_COMMUNITY)
Admission: EM | Admit: 2021-03-24 | Discharge: 2021-03-24 | Disposition: A | Discharge status: Home / Self Care | Payer: Self-pay | Attending: Emergency Medicine | Admitting: Emergency Medicine

## 2021-03-24 DIAGNOSIS — Z7951 Long term (current) use of inhaled steroids: Secondary | ICD-10-CM | POA: Insufficient documentation

## 2021-03-24 DIAGNOSIS — R21 Rash and other nonspecific skin eruption: Secondary | ICD-10-CM | POA: Insufficient documentation

## 2021-03-24 DIAGNOSIS — J449 Chronic obstructive pulmonary disease, unspecified: Secondary | ICD-10-CM | POA: Insufficient documentation

## 2021-03-24 DIAGNOSIS — Z87891 Personal history of nicotine dependence: Secondary | ICD-10-CM | POA: Insufficient documentation

## 2021-03-24 MED ORDER — HYDROCORTISONE 2.5 % EX LOTN: TOPICAL_LOTION | Freq: Two times a day (BID) | CUTANEOUS | 0 refills | Status: DC

## 2021-03-24 NOTE — ED Triage Notes (Signed)
Pt reports insect bite to left side of his back while at work today, redness noted

## 2021-03-24 NOTE — ED Provider Notes (Signed)
[name]   CSN: [id number] Arrival date & time: 03/24/21  1037     History Chief Complaint  Patient presents with   Insect Bite    Daniel Ruiz is a 40 y.o. male with a past medical history of A. fib presenting to the ED for possible insect bite to left back area.  He noticed it today but was unsure exactly what bit him.  Reports irritation in the site.  He is already on antibiotics for a insect bite in his right lower extremity.  He denies any fever, drainage from the area, chills or other rash.  HPI     Past Medical History:  Diagnosis Date   COPD (chronic obstructive pulmonary disease) (HCC)    Irregular heart beat     Patient Active Problem List   Diagnosis Date Noted   Atrial fibrillation with rapid ventricular response (HCC) 03/14/2021    Past Surgical History:  Procedure Laterality Date   ORIF FINGER / THUMB FRACTURE Right        Family History  Problem Relation Age of Onset   Diabetes Mother    Hypertension Mother     Social History   Tobacco Use   Smoking status: Former    Packs/day: 0.50    Types: Cigarettes   Smokeless tobacco: Never  Vaping Use   Vaping Use: Never used  Substance Use Topics   Alcohol use: Yes   Drug use: Yes    Types: Marijuana    Home Medications Prior to Admission medications   Medication Sig Start Date End Date Taking? Authorizing Provider  hydrocortisone 2.5 % lotion Apply topically 2 (two) times daily. 03/24/21  Yes Brylin Stanislawski, PA-C  benzonatate (TESSALON) 100 MG capsule Take 1 capsule (100 mg total) by mouth 3 (three) times daily as needed for cough. Patient not taking: No sig reported 07/05/20   Long, Arlyss Repress, MD  diltiazem (CARDIZEM CD) 240 MG 24 hr capsule Take 1 capsule (240 mg total) by mouth daily. 03/14/21   Filbert Schilder, NP  fluticasone (FLONASE) 50 MCG/ACT nasal spray Place 1 spray into both nostrils daily. Patient not taking: No sig reported  07/21/18   Petrucelli, Samantha R, PA-C  HYDROcodone-acetaminophen (NORCO/VICODIN) 5-325 MG tablet Take 2 tablets by mouth every 6 (six) hours as needed for moderate pain. 03/10/21 03/10/22  Elson Areas, PA-C  ibuprofen (ADVIL,MOTRIN) 800 MG tablet Take 1 tablet (800 mg total) by mouth 3 (three) times daily. Patient not taking: No sig reported 07/21/18   Petrucelli, Samantha R, PA-C  methocarbamol (ROBAXIN) 750 MG tablet Take 1 tablet (750 mg total) by mouth 4 (four) times daily. Patient not taking: No sig reported 03/21/16   Beers, Charmayne Sheer, PA-C  metoprolol tartrate (LOPRESSOR) 25 MG tablet Take 1 tablet (25 mg total) by mouth 2 (two) times daily. 03/14/21   Filbert Schilder, NP  naproxen (NAPROSYN) 500 MG tablet Take 1 tablet (500 mg total) by mouth 2 (two) times daily with a meal. Patient not taking: No sig reported 03/21/16   Beers, Charmayne Sheer, PA-C  ondansetron (ZOFRAN-ODT) 8 MG disintegrating tablet Take 1 tablet (8 mg total) by mouth every 8 (eight) hours as needed for nausea or vomiting. Patient not taking: No sig reported 04/14/18   Derwood Kaplan, MD  oxymetazoline (AFRIN NASAL SPRAY) 0.05 % nasal spray Place 1 spray into both nostrils 2 (two) times daily. x3 days then stop Patient not taking: No sig reported 08/04/18  Rancour, Jeannett Senior, MD  Phenylephrine-APAP-guaiFENesin [facility] FAST-MAX) [phone] MG/20ML LIQD Take 20 mLs by mouth every 4 (four) hours as needed (cough). Patient not taking: No sig reported 02/25/20   Jacalyn Lefevre, MD  sulfamethoxazole-trimethoprim (BACTRIM DS) 800-160 MG tablet Take 1 tablet by mouth 2 (two) times daily. Patient taking differently: Take 1 tablet by mouth See admin instructions. Bid x 10 days 03/10/21   Elson Areas, PA-C    Allergies    Patient has no known allergies.  Review of Systems   Review of Systems  Constitutional:  Negative for chills and fever.  Cardiovascular:  Negative for chest pain.  Skin:  Positive for wound.   Physical  Exam Updated Vital Signs BP 122/71 (BP Location: Left Arm)   Pulse 61   Temp 98.2 F (36.8 C)   Resp 17   SpO2 100%   Physical Exam Vitals and nursing note reviewed.  Constitutional:      General: He is not in acute distress.    Appearance: He is well-developed. He is not diaphoretic.     Comments: No signs of angioedema or anaphylaxis.  HENT:     Head: Normocephalic and atraumatic.  Eyes:     General: No scleral icterus.    Conjunctiva/sclera: Conjunctivae normal.  Pulmonary:     Effort: Pulmonary effort is normal. No respiratory distress.  Musculoskeletal:     Cervical back: Normal range of motion.  Skin:    Findings: Erythema (Left back) present. No rash.  Neurological:     Mental Status: He is alert.     ED Results / Procedures / Treatments   Labs (all labs ordered are listed, but only abnormal results are displayed) Labs Reviewed - No data to display  EKG None  Radiology No results found.  Procedures Procedures   Medications Ordered in ED Medications - No data to display  ED Course  I have reviewed the triage vital signs and the nursing notes.  Pertinent labs & imaging results that were available during my care of the patient were reviewed by me and considered in my medical decision making (see chart for details).    MDM Rules/Calculators/A&P                           40 year old male presenting to the ED for possible insect bite.  Erythema and some swelling noted to left back area as noted in the image.  He has no systemic symptoms and his vital signs within normal limits.  He is already on antibiotics for a wound to his right leg.  I told him to finish this.  Since this happened today at this is too soon to develop an infection.  We will treat with topical steroids for irritation.  Advised him to return if his symptoms get worse or for any signs of infection.   Patient is hemodynamically stable, in NAD, and able to ambulate in the ED. Evaluation  does not show pathology that would require ongoing emergent intervention or inpatient treatment. I explained the diagnosis to the patient. Pain has been managed and has no complaints prior to discharge. Patient is comfortable with above plan and is stable for discharge at this time. All questions were answered prior to disposition. Strict return precautions for returning to the ED were discussed. Encouraged follow up with PCP.   An After Visit Summary was printed and given to the patient.   Portions of this note were generated with  Scientist, clinical (histocompatibility and immunogenetics). Dictation errors may occur despite best attempts at proofreading.  Final Clinical Impression(s) / ED Diagnoses Final diagnoses:  Rash and nonspecific skin eruption    Rx / DC Orders ED Discharge Orders          Ordered    hydrocortisone 2.5 % lotion  2 times daily        03/24/21 [address] PA-C 03/24/21 1139    Wynetta Fines, MD 03/25/21 [phone]

## 2021-03-24 NOTE — Discharge Instructions (Signed)
Use the cream as prescribed. Continue your other medications. Return to the ER for signs of infection including redness, increased pain, drainage from the area, fever.

## 2021-04-11 ENCOUNTER — Encounter (HOSPITAL_COMMUNITY): Payer: Self-pay

## 2021-04-11 ENCOUNTER — Other Ambulatory Visit: Payer: Self-pay

## 2021-04-11 ENCOUNTER — Encounter (HOSPITAL_COMMUNITY): Payer: Self-pay | Admitting: Physician Assistant

## 2021-04-11 ENCOUNTER — Ambulatory Visit (HOSPITAL_COMMUNITY)
Admit: 2021-04-11 | Discharge: 2021-04-11 | Disposition: A | Discharge status: Home / Self Care | Payer: Self-pay | Attending: Physician Assistant | Admitting: Physician Assistant

## 2021-04-11 VITALS — BP 116/78 | HR 83 | Ht 75.0 in | Wt 239.8 lb

## 2021-04-11 DIAGNOSIS — R0681 Apnea, not elsewhere classified: Secondary | ICD-10-CM | POA: Insufficient documentation

## 2021-04-11 DIAGNOSIS — Z87891 Personal history of nicotine dependence: Secondary | ICD-10-CM | POA: Insufficient documentation

## 2021-04-11 DIAGNOSIS — R0683 Snoring: Secondary | ICD-10-CM | POA: Insufficient documentation

## 2021-04-11 DIAGNOSIS — Z79899 Other long term (current) drug therapy: Secondary | ICD-10-CM | POA: Insufficient documentation

## 2021-04-11 DIAGNOSIS — Z8249 Family history of ischemic heart disease and other diseases of the circulatory system: Secondary | ICD-10-CM | POA: Insufficient documentation

## 2021-04-11 DIAGNOSIS — I480 Paroxysmal atrial fibrillation: Secondary | ICD-10-CM | POA: Insufficient documentation

## 2021-04-11 DIAGNOSIS — F1290 Cannabis use, unspecified, uncomplicated: Secondary | ICD-10-CM | POA: Insufficient documentation

## 2021-04-11 DIAGNOSIS — F1090 Alcohol use, unspecified, uncomplicated: Secondary | ICD-10-CM | POA: Insufficient documentation

## 2021-04-11 NOTE — Progress Notes (Signed)
Primary Care Physician: Patient, No Pcp Per (Inactive) Primary Cardiologist: Dr Anne Fu Primary Electrophysiologist: none Referring Physician: Dr Lynnell Dike is a 40 y.o. male with a history of ETOH use, mariajuana use, and atrial fibrillation who presents for consultation in the [facility].  The patient was initially diagnosed with atrial fibrillation 03/14/21 after presenting to the ED with palpitations and lightheadedness. He has had brief palpitations for years but nothing sustained. ECG showed rapid afib. He was about to be cardioverted but sneezed and self converted to SR. Patient has a CHADS2VASC score of 0. He does drink alcohol and also admits to snoring and witnessed apnea.   Today, he denies symptoms of chest pain, shortness of breath, orthopnea, PND, lower extremity edema, dizziness, presyncope, syncope, daytime somnolence, bleeding, or neurologic sequela. The patient is tolerating medications without difficulties and is otherwise without complaint today.    Atrial Fibrillation Risk Factors:  he does have symptoms or diagnosis of sleep apnea. he is agreeable for sleep evaluation.  he does not have a history of rheumatic fever. he does have a history of alcohol use. The patient does have a history of early familial atrial fibrillation or other arrhythmias. Two aunts have afib.  he has a BMI of Body mass index is 29.97 kg/m.Marland Kitchen Filed Weights   04/11/21 1027  Weight: 108.8 kg    Family History  Problem Relation Age of Onset   Diabetes Mother    Hypertension Mother      Atrial Fibrillation Management history:  Previous antiarrhythmic drugs: none Previous cardioversions: none Previous ablations: none CHADS2VASC score: 0 Anticoagulation history: none   Past Medical History:  Diagnosis Date   COPD (chronic obstructive pulmonary disease) (HCC)    Irregular heart beat    Past Surgical History:  Procedure Laterality Date    ORIF FINGER / THUMB FRACTURE Right     Current Outpatient Medications  Medication Sig Dispense Refill   diltiazem (CARDIZEM CD) 240 MG 24 hr capsule Take 1 capsule (240 mg total) by mouth daily. 60 capsule 2   metoprolol tartrate (LOPRESSOR) 25 MG tablet Take 1 tablet (25 mg total) by mouth 2 (two) times daily. 120 tablet 2   No current facility-administered medications for this encounter.    No Known Allergies  Social History   Socioeconomic History   Marital status: Married    Spouse name: Not on file   Number of children: Not on file   Years of education: Not on file   Highest education level: Not on file  Occupational History   Not on file  Tobacco Use   Smoking status: Former    Packs/day: 0.50    Types: Cigarettes   Smokeless tobacco: Never   Tobacco comments:    Former smoker 04/11/2021  Vaping Use   Vaping Use: Never used  Substance and Sexual Activity   Alcohol use: Yes    Alcohol/week: 3.0 - 4.0 standard drinks    Types: 3 - 4 Cans of beer per week    Comment: just started back drinking   Drug use: Yes    Types: Marijuana    Comment: smoking daily   Sexual activity: Yes    Birth control/protection: None  Other Topics Concern   Not on file  Social History Narrative   Not on file   Social Determinants of Health   Financial Resource Strain: Not on file  Food Insecurity: Not on file  Transportation Needs: Not on file  Physical Activity: Not on file  Stress: Not on file  Social Connections: Not on file  Intimate Partner Violence: Not on file     ROS- All systems are reviewed and negative except as per the HPI above.  Physical Exam: Vitals:   04/11/21 1027  BP: 116/78  Pulse: 83  Weight: 108.8 kg  Height: 6\' 3"  (1.905 m)    GEN- The patient is a well appearing male, alert and oriented x 3 today.   Head- normocephalic, atraumatic Eyes-  Sclera clear, conjunctiva pink Ears- hearing intact Oropharynx- clear Neck- supple  Lungs- Clear to  ausculation bilaterally, normal work of breathing Heart- Regular rate and rhythm, no murmurs, rubs or gallops  GI- soft, NT, ND, + BS Extremities- no clubbing, cyanosis, or edema MS- no significant deformity or atrophy Skin- no rash or lesion Psych- euthymic mood, full affect Neuro- strength and sensation are intact  Wt Readings from Last 3 Encounters:  04/11/21 108.8 kg  03/13/21 117.9 kg  03/10/21 108.9 kg    EKG today demonstrates  SR Vent. rate 83 BPM PR interval 134 ms QRS duration 84 ms QT/QTcB 362/425 ms   Epic records are reviewed at length today  CHA2DS2-VASc Score = 0  The patient's score is based upon: CHF History: 0 HTN History: 0 Diabetes History: 0 Stroke History: 0 Vascular Disease History: 0 Age Score: 0 Gender Score: 0       ASSESSMENT AND PLAN: 1. Paroxysmal Atrial Fibrillation (ICD10:  I48.0) The patient's CHA2DS2-VASc score is 0, indicating a 0.2% annual risk of stroke.   General education about afib provided and questions answered. We also discussed his stroke risk and the risks and benefits of anticoagulation. Anticoagulation is not indicated at this time with low CV score. Continue diltiazem 240 mg daily Continue Lopressor 25 mg BID Will check echocardiogram once he has insurance.  Encouraged reduction/cessation of cannabis and alcohol use.   2. Snoring/witnessed apnea The importance of adequate treatment of sleep apnea was discussed today in order to improve our ability to maintain sinus rhythm long term. Will arrange for sleep study once he has insurance.    Follow up in the AF clinic in 3 months.    05/10/21 PA-C Afib Clinic [facility] [address] [place], [place] [phone] 04/11/2021 12:09 PM

## 2021-07-17 ENCOUNTER — Encounter (HOSPITAL_COMMUNITY): Payer: Self-pay

## 2021-07-17 ENCOUNTER — Ambulatory Visit (HOSPITAL_COMMUNITY): Payer: Self-pay | Admitting: Physician Assistant

## 2021-11-20 ENCOUNTER — Emergency Department (HOSPITAL_COMMUNITY): Payer: Self-pay

## 2021-11-20 ENCOUNTER — Emergency Department (HOSPITAL_COMMUNITY)
Admission: EM | Admit: 2021-11-20 | Discharge: 2021-11-20 | Disposition: A | Discharge status: Home / Self Care | Payer: Self-pay | Attending: Emergency Medicine | Admitting: Emergency Medicine

## 2021-11-20 ENCOUNTER — Other Ambulatory Visit: Payer: Self-pay

## 2021-11-20 ENCOUNTER — Encounter (HOSPITAL_COMMUNITY): Payer: Self-pay

## 2021-11-20 DIAGNOSIS — J449 Chronic obstructive pulmonary disease, unspecified: Secondary | ICD-10-CM | POA: Insufficient documentation

## 2021-11-20 DIAGNOSIS — R0981 Nasal congestion: Secondary | ICD-10-CM

## 2021-11-20 DIAGNOSIS — J398 Other specified diseases of upper respiratory tract: Secondary | ICD-10-CM | POA: Insufficient documentation

## 2021-11-20 MED ORDER — OXYMETAZOLINE HCL 0.05 % NA SOLN: 1.0000 | Freq: Two times a day (BID) | NASAL | 0 refills | Status: DC

## 2021-11-20 MED ORDER — GUAIFENESIN ER 600 MG PO TB12: 600.0000 mg | ORAL_TABLET | Freq: Two times a day (BID) | ORAL | 0 refills | Status: AC

## 2021-11-20 NOTE — ED Provider Notes (Signed)
?[facility] ?Provider Note ? ? ?CSN: [id number] ?Arrival date & time: 11/20/21  [id number] ? ?  ? ?History ? ?Chief Complaint  ?Patient presents with  ? Nasal Congestion  ? ? ?Daniel Ruiz is a 41 y.o. male. With past medical history of PAF, COPD, who presents to the emergency department with nasal congestion. ? ?States he has had ongoing nasal congestion for 7 months now.  He states that it is worsened over the past few weeks.  He describes getting up in the morning and having a lot of nasal congestion as well as coughing up phlegm.  States he has been using Vicks vapor rub without improvement in his symptoms.  He denies any fevers, shortness of breath, chest pain, sore throat. ? ?HPI ? ?  ? ?Home Medications ?Prior to Admission medications   ?Medication Sig Start Date End Date Taking? Authorizing Provider  ?diltiazem (CARDIZEM CD) 240 MG 24 hr capsule Take 1 capsule (240 mg total) by mouth daily. 03/14/21   Kathyrn Drown D, NP  ?metoprolol tartrate (LOPRESSOR) 25 MG tablet Take 1 tablet (25 mg total) by mouth 2 (two) times daily. 03/14/21   Tommie Raymond, NP  ?   ? ?Allergies    ?Patient has no known allergies.   ? ?Review of Systems   ?Review of Systems  ?Constitutional:  Negative for fever.  ?HENT:  Positive for congestion and postnasal drip. Negative for ear pain, sinus pressure, sinus pain and sore throat.   ?Respiratory:  Negative for shortness of breath.   ?Cardiovascular:  Negative for chest pain.  ?All other systems reviewed and are negative. ? ?Physical Exam ?Updated Vital Signs ?BP (!) 165/105 (BP Location: Right Arm)   Pulse 87   Temp 99 ?F (37.2 ?C) (Oral)   Ht 6\' 3"  (1.905 m)   Wt 113.4 kg   SpO2 96%   BMI 31.25 kg/m?  ?Physical Exam ?Vitals and nursing note reviewed.  ?Constitutional:   ?   General: He is not in acute distress. ?   Appearance: Normal appearance. He is not ill-appearing or toxic-appearing.  ?HENT:  ?   Head: Normocephalic and atraumatic.  ?    Nose: Congestion present.  ?   Mouth/Throat:  ?   Mouth: Mucous membranes are moist.  ?   Pharynx: Oropharynx is clear. Posterior oropharyngeal erythema present.  ?Eyes:  ?   General: No scleral icterus. ?   Extraocular Movements: Extraocular movements intact.  ?   Pupils: Pupils are equal, round, and reactive to light.  ?Cardiovascular:  ?   Rate and Rhythm: Normal rate and regular rhythm.  ?   Pulses: Normal pulses.  ?   Heart sounds: Normal heart sounds. No murmur heard. ?Pulmonary:  ?   Effort: Pulmonary effort is normal. No respiratory distress.  ?   Breath sounds: Normal breath sounds. No wheezing, rhonchi or rales.  ?Musculoskeletal:  ?   Cervical back: Neck supple.  ?Lymphadenopathy:  ?   Cervical: No cervical adenopathy.  ?Skin: ?   General: Skin is warm and dry.  ?   Findings: No rash.  ?Neurological:  ?   General: No focal deficit present.  ?   Mental Status: He is alert and oriented to person, place, and time. Mental status is at baseline.  ?Psychiatric:     ?   Mood and Affect: Mood normal.     ?   Behavior: Behavior normal.     ?   Thought Content:  Thought content normal.     ?   Judgment: Judgment normal.  ? ? ?ED Results / Procedures / Treatments   ?Labs ?(all labs ordered are listed, but only abnormal results are displayed) ?Labs Reviewed - No data to display ? ?EKG ?None ? ?Radiology ?DG Chest 2 View ? ?Result Date: 11/20/2021 ?CLINICAL DATA:  Shortness of breath nasal congestion EXAM: CHEST - 2 VIEW COMPARISON:  Chest radiograph 03/13/2021 FINDINGS: The cardiomediastinal silhouette is normal. There is no focal consolidation or pulmonary edema. There is no pleural effusion or pneumothorax. There is no acute osseous abnormality. IMPRESSION: No radiographic evidence of acute cardiopulmonary process. Electronically Signed   By: Valetta Mole M.D.   On: 11/20/2021 10:08   ? ?Procedures ?Procedures  ? ?Medications Ordered in ED ?Medications - No data to display ? ?ED Course/ Medical Decision Making/ A&P ?   ?                        ?Medical Decision Making ?Risk ?OTC drugs. ? ?This patient presents to the ED for concern of nasal congestion, this involves an extensive number of treatment options, and is a complaint that carries with it a high risk of complications and morbidity.  The differential diagnosis includes viral URI, chronic sinusitis, seasonal allergies, chronic lung disease such as asthma, COPD, pneumonia, post-nasal drip, etc. ? ?Co morbidities that complicate the patient evaluation ?Paroxysmal atrial fibrillation ? ?Additional history obtained:  ?Additional history obtained from: Wife at bedside ?External records from outside source obtained and reviewed including: Most recent cardiology physician note ? ?EKG: ?Not indicated ? ?Cardiac Monitoring: ?Not indicated ? ? ?Lab Results: ?None indicated ? ?Imaging Studies ordered:  ?I ordered imaging studies which included x-ray.  I independently reviewed & interpreted imaging & am in agreement with radiology impression. ?Imaging shows: ?Chest x-ray within normal limits ? ?Medications  ?-I reviewed the patient's home medications and did not make adjustments. ?-I did  prescribe new home medications. ? ?Tests Considered: ?Respiratory viral panel, chronicity of symptoms not consistent with acute infection ? ?Critical Interventions: ?None required ? ?Consultations: ?None required ? ?SDH ?No primary care, significant other states that she is scheduling a PCP visit in the next 2 to 3 weeks ? ?ED Course: ?41 year old male who presents to the emergency department with chronic congestion symptoms.  ? ?States they have been 6-7 months of symptoms, which have been particularly bothersome over the past month. On no medications. Has not been using any medications to help symptoms.  ? ?No adventitious lung sounds, productive cough, fever. Not consistent with PNA. CXR ordered in triage shows no abnormalities. Doubt PNA.  ?CXR without cardiomegaly concerning for CHF. No evidence  of PTX, pleural effusion to contribute. ? ?He states someone has discussed COPD with him before, but has never seen a pulmonologist, or had formal testing. Does not use home inhalers or O2. He is not wheezing on exam, again no evidence of PNA. Not in respiratory distress or tachypneic. Doubt COPD exacerbation. ? ?No wheezing or history of asthma. ?No sinus pain or pressure concerning for acute bacterial sinusitis. ?No chest pain, palpitations or SOB concerning for ACS. Symptoms inconsistent with PE, myocarditis, pericarditis, dissection. ?Will give him a prescription for mucinex, nasal spray. He is instructed to make an appointment with a PCP. He states he will and significant other states she is calling today to make f/u appointment. May need pulmonology appointment/referral but will defer to PCP. Will trial OTC before  referral.  ? ?After consideration of the diagnostic results and the patients response to treatment, I feel that the patent would benefit from discharge. ?The patient has been appropriately medically screened and/or stabilized in the ED. I have low suspicion for any other emergent medical condition which would require further screening, evaluation or treatment in the ED or require inpatient management. The patient is overall well appearing and non-toxic in appearance. They are hemodynamically stable at time of discharge.   ?Final Clinical Impression(s) / ED Diagnoses ?Final diagnoses:  ?Nasal congestion  ? ? ?Rx / DC Orders ?ED Discharge Orders   ? ?      Ordered  ?  guaiFENesin (MUCINEX) 600 MG 12 hr tablet  2 times daily       ? 11/20/21 1350  ?  oxymetazoline (AFRIN NASAL SPRAY) 0.05 % nasal spray  2 times daily       ? 11/20/21 1350  ? ?  ?  ? ?  ? ? ?  ?Mickie Hillier, PA-C ?11/20/21 1422 ? ?  ?Tegeler, Gwenyth Allegra, MD ?11/20/21 1423 ? ?

## 2021-11-20 NOTE — ED Triage Notes (Signed)
Pt arrived POV from home c/o nasal congestion and not being able to breathe when he lays down. Pt states these symptoms have been on going for 2-3 weeks.  ?

## 2021-11-20 NOTE — ED Provider Triage Note (Signed)
Emergency Medicine Provider Triage Evaluation Note ? ?Daniel Ruiz , a 41 y.o. male  was evaluated in triage.  Pt complains of flulike symptoms.  Bothered most by cough.  Productive in nature, clear sputum. ? ?Review of Systems  ?Positive: Cough, congestion ?Negative:  ? ?Physical Exam  ?BP (!) 165/105 (BP Location: Right Arm)   Pulse 87   Temp 99 ?F (37.2 ?C) (Oral)   SpO2 96%  ?Gen:   Awake, no distress   ?Resp:  Normal effort  ?MSK:   Moves extremities without difficulty  ?Other:  RRR, A-fib listed in patient's chart however currently not in A-fib ? ?Medical Decision Making  ?Medically screening exam initiated at 9:46 AM.  Appropriate orders placed.  Daniel Ruiz was informed that the remainder of the evaluation will be completed by another provider, this initial triage assessment does not replace that evaluation, and the importance of remaining in the ED until their evaluation is complete. ? ? ?  ?Saddie Benders, PA-C ?11/20/21 6226 ? ?

## 2021-11-20 NOTE — Discharge Instructions (Signed)
You were seen in the emergency department today for nasal congestion and cough. This may be a viral infection or seasonal allergies. You may also have underlying lung disease as you discussed with me at bedside. I would like you to follow-up with your PCP in order to get the appropriate referral and follow-up to pulmonology. Please return for any worsening symptoms including shortness of breath or chest pain.  ?

## 2022-03-25 ENCOUNTER — Encounter (HOSPITAL_COMMUNITY): Payer: Self-pay | Admitting: Emergency Medicine

## 2022-03-25 ENCOUNTER — Emergency Department (HOSPITAL_COMMUNITY)
Admission: EM | Admit: 2022-03-25 | Discharge: 2022-03-25 | Disposition: A | Discharge status: Home / Self Care | Payer: Self-pay | Attending: Emergency Medicine | Admitting: Emergency Medicine

## 2022-03-25 ENCOUNTER — Emergency Department (HOSPITAL_COMMUNITY): Payer: Self-pay

## 2022-03-25 ENCOUNTER — Other Ambulatory Visit: Payer: Self-pay

## 2022-03-25 DIAGNOSIS — R109 Unspecified abdominal pain: Secondary | ICD-10-CM

## 2022-03-25 DIAGNOSIS — J449 Chronic obstructive pulmonary disease, unspecified: Secondary | ICD-10-CM | POA: Insufficient documentation

## 2022-03-25 DIAGNOSIS — R10A2 Flank pain, left side: Secondary | ICD-10-CM

## 2022-03-25 DIAGNOSIS — K5900 Constipation, unspecified: Secondary | ICD-10-CM | POA: Insufficient documentation

## 2022-03-25 LAB — URINALYSIS, ROUTINE W REFLEX MICROSCOPIC
Bilirubin Urine: NEGATIVE
Glucose, UA: NEGATIVE mg/dL
Ketones, ur: NEGATIVE mg/dL
Leukocytes,Ua: NEGATIVE
Nitrite: NEGATIVE
Protein, ur: NEGATIVE mg/dL
Specific Gravity, Urine: 1.019 (ref 1.005–1.030)
pH: 5 (ref 5.0–8.0)

## 2022-03-25 LAB — CBC
HCT: 43.1 % (ref 39.0–52.0)
Hemoglobin: 14.4 g/dL (ref 13.0–17.0)
MCH: 30.7 pg (ref 26.0–34.0)
MCHC: 33.4 g/dL (ref 30.0–36.0)
MCV: 91.9 fL (ref 80.0–100.0)
Platelets: 233 10*3/uL (ref 150–400)
RBC: 4.69 MIL/uL (ref 4.22–5.81)
RDW: 12.2 % (ref 11.5–15.5)
WBC: 5.8 10*3/uL (ref 4.0–10.5)
nRBC: 0 % (ref 0.0–0.2)

## 2022-03-25 LAB — COMPREHENSIVE METABOLIC PANEL
ALT: 34 U/L (ref 0–44)
AST: 23 U/L (ref 15–41)
Albumin: 4 g/dL (ref 3.5–5.0)
Alkaline Phosphatase: 48 U/L (ref 38–126)
Anion gap: 9 (ref 5–15)
BUN: 12 mg/dL (ref 6–20)
CO2: 25 mmol/L (ref 22–32)
Calcium: 9 mg/dL (ref 8.9–10.3)
Chloride: 106 mmol/L (ref 98–111)
Creatinine, Ser: 1.18 mg/dL (ref 0.61–1.24)
GFR, Estimated: >60 mL/min (ref >60)
Glucose, Bld: 131 mg/dL — ABNORMAL HIGH (ref 70–99)
Potassium: 3.7 mmol/L (ref 3.5–5.1)
Sodium: 140 mmol/L (ref 135–145)
Total Bilirubin: 0.8 mg/dL (ref 0.3–1.2)
Total Protein: 7 g/dL (ref 6.5–8.1)

## 2022-03-25 LAB — LIPASE, BLOOD: Lipase: 34 U/L (ref 11–51)

## 2022-03-25 MED ORDER — KETOROLAC TROMETHAMINE 30 MG/ML IJ SOLN
30.0000 mg | Freq: Once | INTRAMUSCULAR | Status: AC
  Administered 2022-03-25: 30 mg via INTRAMUSCULAR
  Filled 2022-03-25: qty 1

## 2022-03-25 NOTE — ED Triage Notes (Signed)
Patient here with complaint of sharp left sided abdominal pain that started after sneezing yesterday. Patient denies vomiting, denies diarrhea. States he has been unable to have a bowel movement since onset of pain. Patient is alert, oriented, and in no apparent distress at this time.

## 2022-03-25 NOTE — Discharge Instructions (Signed)
Back Pain:  Back pain is very common.  The pain often gets better over time.  The cause of back pain is usually not dangerous.  Most people can learn to manage their back pain on their own.  However if you develop severe or worsening pain, low back pain with fever, numbness, weakness or inability to walk or urinate/stool, you should return to the ER immediately.  Please follow up with your doctor this week for a recheck if still having symptoms.  Low back pain is discomfort in the lower back that may be due to injuries to muscles and ligaments around the spine.  Occasionally, it may be caused by a a problem to a part of the spine called a disc. The pain may last several days or a week;  However, most patients get completely well in 4 weeks.  Medications are also useful to help with pain control.  A commonly prescribed medications includes acetaminophen.  This medication is generally safe, though you should not take more than 8 of the extra strength (500mg ) pills a day.  Non steroidal anti inflammatory medications including Ibuprofen and naproxen;  These medications help both pain and swelling and are very useful in treating back pain.  They should be taken with food, as they can cause stomach upset, and more seriously, stomach bleeding.    Be aware that if you develop new symptoms, such as a fever, leg weakness, difficulty with or loss of control of your urine or bowels, abdominal pain, or more severe pain, you will need to seek medical attention and  / or return to the Emergency department.    Home Care Stay active.  Start with short walks on flat ground if you can.  Try to walk farther each day. Do not sit, drive or stand in one place for more than 30 minutes.  Do not stay in bed. Do not avoid exercise or work.  Activity can help your back heal faster. Be careful when you bend or lift an object.  Bend at your knees, keep the object close to you, and do not twist. Sleep on a firm mattress.  Lie  on your side, and bend your knees.  If you lie on your back, put a pillow under your knees. Only take medicines as told by your doctor. Put ice on the injured area. Put ice in a plastic bag Place a towel between your skin and the bag Leave the ice on for 15-20 minutes, 3-4 times a day for the first 2-3 days. 210 After that, you can switch between ice and heat packs. Ask your doctor about back exercises or massage. Avoid feeling anxious or stressed.  Find good ways to deal with stress, such as exercise.  Get Help Right Way If: Your pain does not go away with rest or medicine. Your pain does not go away in 1 week. You have new problems. You do not feel well. The pain spreads into your legs. You cannot control when you poop (bowel movement) or pee (urinate) You feel sick to your stomach (nauseous) or throw up (vomit) You have belly (abdominal) pain. You feel like you may pass out (faint). If you develop a fever.  Make Sure you: Understand these instructions. Watch your condition Get help right away if you are not doing well or get worse.  Managing Pain  Pain after surgery or related to activity is often due to strain/injury to muscle, tendon, nerves and/or incisions.  This pain is usually short-term  and will improve in a few months.   Many people find it helpful to do the following things TOGETHER to help speed the process of healing and to get back to regular activity more quickly:  Avoid heavy physical activity  no lifting greater than 20 pounds Do not "push through" the pain.  Listen to your body and avoid positions and maneuvers than reproduce the pain Walking is okay as tolerated, but go slowly and stop when getting sore.  Remember: If it hurts to do it, then don't do it! Take Anti-inflammatory medication  Take with food/snack around the clock for 1-2 weeks This helps the muscle and nerve tissues become less irritable and calm down faster Choose ONE of the following  over-the-counter medications: Naproxen 220mg  tabs (ex. Aleve) 1-2 pills twice a day  Ibuprofen 200mg  tabs (ex. Advil, Motrin) 3-4 pills with every meal and just before bedtime Acetaminophen 500mg  tabs (Tylenol) 1-2 pills with every meal and just before bedtime Use a Heating pad or Ice/Cold Pack 4-6 times a day May use warm bath/hottub  or showers Try Gentle Massage and/or Stretching  at the area of pain many times a day stop if you feel pain - do not overdo it  Try these steps together to help you body heal faster and avoid making things get worse.  Doing just one of these things may not be enough.    If you are not getting better after two weeks or are noticing you are getting worse, contact our office for further advice; we may need to re-evaluate you & see what other things we can do to help.
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
## 2022-03-25 NOTE — ED Notes (Signed)
Patient transported to CT 

## 2022-04-24 ENCOUNTER — Emergency Department (HOSPITAL_COMMUNITY)
Admission: EM | Admit: 2022-04-24 | Discharge: 2022-04-24 | Disposition: A | Discharge status: Home / Self Care | Payer: Self-pay | Attending: Emergency Medicine | Admitting: Emergency Medicine

## 2022-04-24 ENCOUNTER — Other Ambulatory Visit: Payer: Self-pay

## 2022-04-24 DIAGNOSIS — R739 Hyperglycemia, unspecified: Secondary | ICD-10-CM | POA: Insufficient documentation

## 2022-04-24 DIAGNOSIS — R197 Diarrhea, unspecified: Secondary | ICD-10-CM | POA: Insufficient documentation

## 2022-04-24 DIAGNOSIS — R319 Hematuria, unspecified: Secondary | ICD-10-CM | POA: Insufficient documentation

## 2022-04-24 DIAGNOSIS — R109 Unspecified abdominal pain: Secondary | ICD-10-CM | POA: Insufficient documentation

## 2022-04-24 LAB — URINALYSIS, ROUTINE W REFLEX MICROSCOPIC
Bacteria, UA: NONE SEEN
Bilirubin Urine: NEGATIVE
Glucose, UA: NEGATIVE mg/dL
Ketones, ur: NEGATIVE mg/dL
Leukocytes,Ua: NEGATIVE
Nitrite: NEGATIVE
Protein, ur: NEGATIVE mg/dL
Specific Gravity, Urine: 1.017 (ref 1.005–1.030)
pH: 5 (ref 5.0–8.0)

## 2022-04-24 LAB — COMPREHENSIVE METABOLIC PANEL
ALT: 35 U/L (ref 0–44)
AST: 25 U/L (ref 15–41)
Albumin: 3.9 g/dL (ref 3.5–5.0)
Alkaline Phosphatase: 47 U/L (ref 38–126)
Anion gap: 12 (ref 5–15)
BUN: 10 mg/dL (ref 6–20)
CO2: 22 mmol/L (ref 22–32)
Calcium: 9.3 mg/dL (ref 8.9–10.3)
Chloride: 105 mmol/L (ref 98–111)
Creatinine, Ser: 0.99 mg/dL (ref 0.61–1.24)
GFR, Estimated: >60 mL/min (ref >60)
Glucose, Bld: 111 mg/dL — ABNORMAL HIGH (ref 70–99)
Potassium: 4 mmol/L (ref 3.5–5.1)
Sodium: 139 mmol/L (ref 135–145)
Total Bilirubin: 0.7 mg/dL (ref 0.3–1.2)
Total Protein: 6.9 g/dL (ref 6.5–8.1)

## 2022-04-24 LAB — LIPASE, BLOOD: Lipase: 36 U/L (ref 11–51)

## 2022-04-24 LAB — CBC
HCT: 41.4 % (ref 39.0–52.0)
Hemoglobin: 14 g/dL (ref 13.0–17.0)
MCH: 30.8 pg (ref 26.0–34.0)
MCHC: 33.8 g/dL (ref 30.0–36.0)
MCV: 91.2 fL (ref 80.0–100.0)
Platelets: 211 10*3/uL (ref 150–400)
RBC: 4.54 MIL/uL (ref 4.22–5.81)
RDW: 12.1 % (ref 11.5–15.5)
WBC: 5 10*3/uL (ref 4.0–10.5)
nRBC: 0 % (ref 0.0–0.2)

## 2022-04-24 NOTE — ED Triage Notes (Signed)
Pt. Stated, Ive had diarrhea with stomach pain. Normally I go to [facility] 3-4 times a day.

## 2022-04-24 NOTE — ED Provider Notes (Signed)
[facility] EMERGENCY DEPARTMENT Provider Note   CSN: [id number] Arrival date & time: 04/24/22  0626     History  Chief Complaint  Patient presents with   Diarrhea   Abdominal Pain   HPI Daniel Ruiz is a 41 y.o. male presenting for abdominal pain.  Pain started 2 days ago.  Started just after he had eaten some pork chops.  Pain is located at the umbilicus.  It is nonradiating.  Also endorses associated diarrhea.  Has not taken anything to alleviate his symptoms.  Patient stated that his symptoms have improved as of abdominal pain is completely gone.  Denies chest pain shortness of breath.  Denies emesis.   Diarrhea Associated symptoms: abdominal pain   Abdominal Pain Associated symptoms: diarrhea        Home Medications Prior to Admission medications   Medication Sig Start Date End Date Taking? Authorizing Provider  diltiazem (CARDIZEM CD) 240 MG 24 hr capsule Take 1 capsule (240 mg total) by mouth daily. 03/14/21   Kathyrn Drown D, NP  metoprolol tartrate (LOPRESSOR) 25 MG tablet Take 1 tablet (25 mg total) by mouth 2 (two) times daily. 03/14/21   Tommie Raymond, NP  oxymetazoline (AFRIN NASAL SPRAY) 0.05 % nasal spray Place 1 spray into both nostrils 2 (two) times daily. 11/20/21   Mickie Hillier, PA-C      Allergies    Patient has no known allergies.    Review of Systems   Review of Systems  Gastrointestinal:  Positive for abdominal pain and diarrhea.    Physical Exam Updated Vital Signs BP (!) 110/93   Pulse 86   Temp 98.6 F (37 C) (Oral)   Resp 18   Ht 6\' 3"  (1.905 m)   Wt 113.4 kg   SpO2 98%   BMI 31.25 kg/m  Physical Exam Vitals and nursing note reviewed.  HENT:     Head: Normocephalic and atraumatic.     Mouth/Throat:     Mouth: Mucous membranes are moist.  Eyes:     General:        Right eye: No discharge.        Left eye: No discharge.     Conjunctiva/sclera: Conjunctivae normal.  Cardiovascular:     Rate and Rhythm:  Normal rate and regular rhythm.     Pulses: Normal pulses.     Heart sounds: Normal heart sounds.  Pulmonary:     Effort: Pulmonary effort is normal.     Breath sounds: Normal breath sounds.  Abdominal:     General: Abdomen is flat.     Palpations: Abdomen is soft.     Tenderness: There is no abdominal tenderness.  Skin:    General: Skin is warm and dry.  Neurological:     General: No focal deficit present.  Psychiatric:        Mood and Affect: Mood normal.     ED Results / Procedures / Treatments   Labs (all labs ordered are listed, but only abnormal results are displayed) Labs Reviewed  COMPREHENSIVE METABOLIC PANEL - Abnormal; Notable for the following components:      Result Value   Glucose, Bld 111 (*)    All other components within normal limits  URINALYSIS, ROUTINE W REFLEX MICROSCOPIC - Abnormal; Notable for the following components:   Hgb urine dipstick SMALL (*)    All other components within normal limits  LIPASE, BLOOD  CBC    EKG None  Radiology No results  found.  Procedures Procedures    Medications Ordered in ED Medications - No data to display  ED Course/ Medical Decision Making/ A&P                           Medical Decision Making  This patient presents to the ED for concern of abdominal pain, this involves a number of treatment options, and is a complaint that carries with it a high risk of complications and morbidity.  The differential diagnosis includes pancreatitis, PUD, gallbladder pathology, and nephrolithiasis and foodborne illness.   Co morbidities: Discussed in HPI   EMR reviewed including pt PMHx, past surgical history and past visits to ER.   See HPI for more details   Lab Tests:   I ordered and independently interpreted labs. Labs notable for hyperglycemia and hematuria   Imaging Studies:  No imaging studies ordered for this patient    Cardiac Monitoring:  The patient was maintained on a cardiac monitor.  I  personally viewed and interpreted the cardiac monitored which showed an underlying rhythm of: NSR NA   Medicines ordered:  No meds given Reevaluation of the patient after these medicines showed that the patient improved I have reviewed the patients home medicines and have made adjustments as needed    Consults/Attending Physician   I discussed this case with my attending physician who cosigned this note including patient's presenting symptoms, physical exam, and planned diagnostics and interventions. Attending physician stated agreement with plan or made changes to plan which were implemented.   Reevaluation:  After the interventions noted above I re-evaluated patient and found that they have :improved   Problem List / ED Course: Patient presented for abdominal pain and associated diarrhea.  Did consider an intra-abdominal process but unlikely given pertinent labs are reassuring.  Symptoms are likely related to foodborne illness given that he had 48 hours of normal pain and diarrhea which is now completely resolved per patient.  Discussed return precautions.   Dispostion:  After consideration of the diagnostic results and the patients response to treatment, I feel that the patient would benefit from discharge and conservative treatment at home for likely foodborne illness causing his symptoms.         Final Clinical Impression(s) / ED Diagnoses Final diagnoses:  Diarrhea, unspecified type  Abdominal pain, unspecified abdominal location    Rx / DC Orders ED Discharge Orders     None         Gareth Eagle, PA-C 04/24/22 1105    Gerhard Munch, MD 04/24/22 1203

## 2022-04-24 NOTE — Discharge Instructions (Signed)
Evaluation for your abdominal pain associated diarrhea was overall reassuring.  Symptoms are likely related to foodborne illness of some kind.  It is reassuring that your symptoms are have resolved in 48 hours.  If you have worsening abdominal pain, bloody stools or bloody vomit or bloody urine please return to the emergency department for further evaluation.

## 2022-04-24 NOTE — ED Notes (Signed)
Pt states that he is feeling better and wants to go home, states that he just needs a work note.  Pt states that he wants to go to the cafe to get some food.

## 2022-07-05 ENCOUNTER — Emergency Department (HOSPITAL_COMMUNITY)
Admission: EM | Admit: 2022-07-05 | Discharge: 2022-07-06 | Discharge status: Against Medical Advice | Payer: Self-pay | Attending: Emergency Medicine | Admitting: Emergency Medicine

## 2022-07-05 DIAGNOSIS — Z1152 Encounter for screening for COVID-19: Secondary | ICD-10-CM | POA: Insufficient documentation

## 2022-07-05 DIAGNOSIS — R42 Dizziness and giddiness: Secondary | ICD-10-CM | POA: Insufficient documentation

## 2022-07-05 DIAGNOSIS — Z5321 Procedure and treatment not carried out due to patient leaving prior to being seen by health care provider: Secondary | ICD-10-CM | POA: Insufficient documentation

## 2022-07-05 DIAGNOSIS — R112 Nausea with vomiting, unspecified: Secondary | ICD-10-CM | POA: Insufficient documentation

## 2022-07-05 NOTE — ED Triage Notes (Signed)
Cramping and vomiting starting a few hours ago. No other complaints. 500 NS given. This is reportedly what he has when he experiences heartburn. Does not take anything for it.

## 2022-07-06 ENCOUNTER — Emergency Department (HOSPITAL_COMMUNITY): Payer: Self-pay

## 2022-07-06 LAB — COMPREHENSIVE METABOLIC PANEL
ALT: 64 U/L — ABNORMAL HIGH (ref 0–44)
AST: 38 U/L (ref 15–41)
Albumin: 4 g/dL (ref 3.5–5.0)
Alkaline Phosphatase: 46 U/L (ref 38–126)
Anion gap: 11 (ref 5–15)
BUN: 14 mg/dL (ref 6–20)
CO2: 25 mmol/L (ref 22–32)
Calcium: 9 mg/dL (ref 8.9–10.3)
Chloride: 103 mmol/L (ref 98–111)
Creatinine, Ser: 1.3 mg/dL — ABNORMAL HIGH (ref 0.61–1.24)
GFR, Estimated: >60 mL/min (ref >60)
Glucose, Bld: 130 mg/dL — ABNORMAL HIGH (ref 70–99)
Potassium: 3.8 mmol/L (ref 3.5–5.1)
Sodium: 139 mmol/L (ref 135–145)
Total Bilirubin: 0.8 mg/dL (ref 0.3–1.2)
Total Protein: 7.3 g/dL (ref 6.5–8.1)

## 2022-07-06 LAB — CBC WITH DIFFERENTIAL/PLATELET
Abs Immature Granulocytes: 0.04 10*3/uL (ref 0.00–0.07)
Basophils Absolute: 0 10*3/uL (ref 0.0–0.1)
Basophils Relative: 1 %
Eosinophils Absolute: 0.1 10*3/uL (ref 0.0–0.5)
Eosinophils Relative: 2 %
HCT: 42.6 % (ref 39.0–52.0)
Hemoglobin: 14 g/dL (ref 13.0–17.0)
Immature Granulocytes: 1 %
Lymphocytes Relative: 33 %
Lymphs Abs: 2.2 10*3/uL (ref 0.7–4.0)
MCH: 29.9 pg (ref 26.0–34.0)
MCHC: 32.9 g/dL (ref 30.0–36.0)
MCV: 90.8 fL (ref 80.0–100.0)
Monocytes Absolute: 0.6 10*3/uL (ref 0.1–1.0)
Monocytes Relative: 9 %
Neutro Abs: 3.6 10*3/uL (ref 1.7–7.7)
Neutrophils Relative %: 54 %
Platelets: 234 10*3/uL (ref 150–400)
RBC: 4.69 MIL/uL (ref 4.22–5.81)
RDW: 12.3 % (ref 11.5–15.5)
WBC: 6.6 10*3/uL (ref 4.0–10.5)
nRBC: 0 % (ref 0.0–0.2)

## 2022-07-06 LAB — LIPASE, BLOOD: Lipase: 35 U/L (ref 11–51)

## 2022-07-06 LAB — RESP PANEL BY RT-PCR (RSV, FLU A&B, COVID)  RVPGX2
Influenza A by PCR: NEGATIVE
Influenza B by PCR: NEGATIVE
Resp Syncytial Virus by PCR: NEGATIVE
SARS Coronavirus 2 by RT PCR: NEGATIVE

## 2022-07-06 MED ORDER — ONDANSETRON 4 MG PO TBDP: 8.0000 mg | ORAL_TABLET | Freq: Once | ORAL | Status: DC

## 2022-07-06 MED ORDER — ONDANSETRON HCL 4 MG/2ML IJ SOLN
4.0000 mg | Freq: Once | INTRAMUSCULAR | Status: AC
  Administered 2022-07-06: 4 mg via INTRAVENOUS
  Filled 2022-07-06: qty 2

## 2022-07-06 NOTE — ED Notes (Signed)
Patient left on own accord °

## 2022-07-06 NOTE — ED Provider Triage Note (Signed)
Emergency Medicine Provider Triage Evaluation Note  Daniel Ruiz , a 41 y.o. male  was evaluated in triage.  Pt complains of presents with dizziness stomach pains nausea vomiting, states this has been on for about a month's time, but states is gotten worse today, states that this to be happens when he has acid reflux, he states he has a slight cough and congestion states has been having diarrhea he denies any recent sick contacts, no abdominal surgeries, he has no headache change in vision paresthesias weakness upper lower extremities..  Review of Systems  Positive: Stomach pain, dizziness Negative: Chest, shortness of breath  Physical Exam  BP 133/89 (BP Location: Right Arm)   Pulse (!) 104   Temp 97.7 F (36.5 C) (Oral)   Resp 18   SpO2 97%  Gen:   Awake, no distress   Resp:  Normal effort  MSK:   Moves extremities without difficulty  Other:  Cranial nerves II through XII grossly intact no difficulty with word finding following two-step commands there is no unilateral weakness present.  Medical Decision Making  Medically screening exam initiated at 12:00 AM.  Appropriate orders placed.  Daniel Ruiz was informed that the remainder of the evaluation will be completed by another provider, this initial triage assessment does not replace that evaluation, and the importance of remaining in the ED until their evaluation is complete.  Lab work imaging ordered will need further workup.   Carroll Sage, PA-C 07/06/22 0002

## 2022-08-17 ENCOUNTER — Emergency Department (HOSPITAL_COMMUNITY): Payer: Self-pay

## 2022-08-17 ENCOUNTER — Emergency Department (HOSPITAL_COMMUNITY)
Admission: EM | Admit: 2022-08-17 | Discharge: 2022-08-17 | Disposition: A | Discharge status: Home / Self Care | Payer: Self-pay | Attending: Emergency Medicine | Admitting: Emergency Medicine

## 2022-08-17 ENCOUNTER — Encounter (HOSPITAL_COMMUNITY): Payer: Self-pay

## 2022-08-17 DIAGNOSIS — Y92410 Unspecified street and highway as the place of occurrence of the external cause: Secondary | ICD-10-CM | POA: Diagnosis not present

## 2022-08-17 DIAGNOSIS — M542 Cervicalgia: Secondary | ICD-10-CM | POA: Insufficient documentation

## 2022-08-17 DIAGNOSIS — M25512 Pain in left shoulder: Secondary | ICD-10-CM | POA: Diagnosis not present

## 2022-08-17 MED ORDER — KETOROLAC TROMETHAMINE 15 MG/ML IJ SOLN
15.0000 mg | Freq: Once | INTRAMUSCULAR | Status: AC
  Administered 2022-08-17: 15 mg via INTRAMUSCULAR
  Filled 2022-08-17: qty 1

## 2022-08-17 MED ORDER — ACETAMINOPHEN 500 MG PO TABS
1000.0000 mg | ORAL_TABLET | Freq: Once | ORAL | Status: AC
  Administered 2022-08-17: 1000 mg via ORAL
  Filled 2022-08-17: qty 2
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
## 2022-08-17 NOTE — ED Notes (Signed)
C collar placed on pt per Lavonna Rua, PA

## 2022-08-20 ENCOUNTER — Other Ambulatory Visit: Payer: Self-pay

## 2022-08-20 ENCOUNTER — Encounter (HOSPITAL_COMMUNITY): Payer: Self-pay

## 2022-08-20 ENCOUNTER — Emergency Department (HOSPITAL_COMMUNITY)
Admission: EM | Admit: 2022-08-20 | Discharge: 2022-08-20 | Disposition: A | Discharge status: Home / Self Care | Payer: Self-pay | Attending: Student | Admitting: Student

## 2022-08-20 ENCOUNTER — Emergency Department (HOSPITAL_COMMUNITY): Payer: Self-pay

## 2022-08-20 DIAGNOSIS — Y92410 Unspecified street and highway as the place of occurrence of the external cause: Secondary | ICD-10-CM | POA: Diagnosis not present

## 2022-08-20 DIAGNOSIS — Z87891 Personal history of nicotine dependence: Secondary | ICD-10-CM | POA: Diagnosis not present

## 2022-08-20 DIAGNOSIS — S39012A Strain of muscle, fascia and tendon of lower back, initial encounter: Secondary | ICD-10-CM | POA: Diagnosis not present

## 2022-08-20 DIAGNOSIS — S199XXA Unspecified injury of neck, initial encounter: Secondary | ICD-10-CM | POA: Diagnosis present

## 2022-08-20 DIAGNOSIS — J449 Chronic obstructive pulmonary disease, unspecified: Secondary | ICD-10-CM | POA: Insufficient documentation

## 2022-08-20 DIAGNOSIS — S161XXA Strain of muscle, fascia and tendon at neck level, initial encounter: Secondary | ICD-10-CM | POA: Diagnosis not present

## 2022-08-20 DIAGNOSIS — Z79899 Other long term (current) drug therapy: Secondary | ICD-10-CM | POA: Insufficient documentation

## 2022-08-20 MED ORDER — ACETAMINOPHEN 500 MG PO TABS: 1000.0000 mg | ORAL_TABLET | Freq: Three times a day (TID) | ORAL | 0 refills | Status: AC

## 2022-08-20 MED ORDER — KETOROLAC TROMETHAMINE 15 MG/ML IJ SOLN
15.0000 mg | Freq: Once | INTRAMUSCULAR | Status: AC
  Administered 2022-08-20: 15 mg via INTRAMUSCULAR
  Filled 2022-08-20: qty 1

## 2022-08-20 MED ORDER — LIDOCAINE 5 % EX PTCH
2.0000 | MEDICATED_PATCH | CUTANEOUS | Status: DC
  Administered 2022-08-20: 2 via TRANSDERMAL
  Filled 2022-08-20: qty 2

## 2022-08-20 MED ORDER — NAPROXEN 375 MG PO TABS: 375.0000 mg | ORAL_TABLET | Freq: Two times a day (BID) | ORAL | 0 refills | Status: DC

## 2022-08-20 MED ORDER — DIAZEPAM 5 MG PO TABS
5.0000 mg | ORAL_TABLET | Freq: Once | ORAL | Status: AC
  Administered 2022-08-20: 5 mg via ORAL
  Filled 2022-08-20: qty 1

## 2022-08-20 MED ORDER — DIAZEPAM 5 MG PO TABS: 5.0000 mg | ORAL_TABLET | Freq: Three times a day (TID) | ORAL | 0 refills | Status: DC | PRN
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
## 2022-09-13 ENCOUNTER — Other Ambulatory Visit: Payer: Self-pay

## 2022-09-13 ENCOUNTER — Emergency Department (HOSPITAL_COMMUNITY): Payer: Self-pay

## 2022-09-13 ENCOUNTER — Emergency Department (HOSPITAL_COMMUNITY)
Admission: EM | Admit: 2022-09-13 | Discharge: 2022-09-13 | Disposition: A | Discharge status: Home / Self Care | Payer: Self-pay | Attending: Emergency Medicine | Admitting: Emergency Medicine

## 2022-09-13 ENCOUNTER — Encounter (HOSPITAL_COMMUNITY): Payer: Self-pay

## 2022-09-13 DIAGNOSIS — Z79899 Other long term (current) drug therapy: Secondary | ICD-10-CM | POA: Insufficient documentation

## 2022-09-13 DIAGNOSIS — Z1152 Encounter for screening for COVID-19: Secondary | ICD-10-CM | POA: Insufficient documentation

## 2022-09-13 DIAGNOSIS — R042 Hemoptysis: Secondary | ICD-10-CM | POA: Insufficient documentation

## 2022-09-13 DIAGNOSIS — Z87891 Personal history of nicotine dependence: Secondary | ICD-10-CM | POA: Insufficient documentation

## 2022-09-13 DIAGNOSIS — J40 Bronchitis, not specified as acute or chronic: Secondary | ICD-10-CM | POA: Insufficient documentation

## 2022-09-13 LAB — BASIC METABOLIC PANEL
Anion gap: 10 (ref 5–15)
BUN: 10 mg/dL (ref 6–20)
CO2: 23 mmol/L (ref 22–32)
Calcium: 8.9 mg/dL (ref 8.9–10.3)
Chloride: 105 mmol/L (ref 98–111)
Creatinine, Ser: 0.93 mg/dL (ref 0.61–1.24)
GFR, Estimated: >60 mL/min (ref >60)
Glucose, Bld: 99 mg/dL (ref 70–99)
Potassium: 4.2 mmol/L (ref 3.5–5.1)
Sodium: 138 mmol/L (ref 135–145)

## 2022-09-13 LAB — CBC
HCT: 41.3 % (ref 39.0–52.0)
Hemoglobin: 14.2 g/dL (ref 13.0–17.0)
MCH: 30.6 pg (ref 26.0–34.0)
MCHC: 34.4 g/dL (ref 30.0–36.0)
MCV: 89 fL (ref 80.0–100.0)
Platelets: 219 10*3/uL (ref 150–400)
RBC: 4.64 MIL/uL (ref 4.22–5.81)
RDW: 12.2 % (ref 11.5–15.5)
WBC: 5.3 10*3/uL (ref 4.0–10.5)
nRBC: 0 % (ref 0.0–0.2)

## 2022-09-13 LAB — RESP PANEL BY RT-PCR (RSV, FLU A&B, COVID)  RVPGX2
Influenza A by PCR: NEGATIVE
Influenza B by PCR: NEGATIVE
Resp Syncytial Virus by PCR: NEGATIVE
SARS Coronavirus 2 by RT PCR: NEGATIVE

## 2022-09-13 MED ORDER — AZITHROMYCIN 250 MG PO TABS: ORAL_TABLET | ORAL | 0 refills | Status: DC

## 2022-09-13 MED ORDER — GUAIFENESIN-DM 100-10 MG/5ML PO SYRP
15.0000 mL | ORAL_SOLUTION | Freq: Once | ORAL | Status: AC
  Administered 2022-09-13: 15 mL via ORAL
  Filled 2022-09-13: qty 15
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
## 2022-09-13 NOTE — ED Triage Notes (Signed)
Pt states he had 2 episodes where he coughed up bood tinged sputum. Pt c.o sinus issues the past few days, denies sob or chest pain.

## 2022-09-13 NOTE — Discharge Instructions (Addendum)
It was our pleasure to provide your ER care today - we hope that you feel better.  Drink plenty of fluids/stay well hydrated. Take zithromax (antibiotic as prescribed).   You may try myquil, mucinex, robitussin or similar over the counter cold and flu medication for symptom relief.   Follow up with primary care doctor in one - two weeks if symptoms fail to improve/resolve.  Return to ER if worse, new symptoms, increased trouble breathing, or other emergency concern.

## 2022-12-14 DIAGNOSIS — Z13228 Encounter for screening for other metabolic disorders: Secondary | ICD-10-CM | POA: Diagnosis not present

## 2022-12-14 DIAGNOSIS — Z125 Encounter for screening for malignant neoplasm of prostate: Secondary | ICD-10-CM | POA: Diagnosis not present

## 2022-12-14 DIAGNOSIS — I4891 Unspecified atrial fibrillation: Secondary | ICD-10-CM | POA: Diagnosis not present

## 2022-12-14 DIAGNOSIS — Z79899 Other long term (current) drug therapy: Secondary | ICD-10-CM | POA: Diagnosis not present

## 2022-12-14 DIAGNOSIS — Z1211 Encounter for screening for malignant neoplasm of colon: Secondary | ICD-10-CM | POA: Diagnosis not present

## 2022-12-14 DIAGNOSIS — R053 Chronic cough: Secondary | ICD-10-CM | POA: Diagnosis not present

## 2022-12-14 DIAGNOSIS — R0981 Nasal congestion: Secondary | ICD-10-CM | POA: Diagnosis not present

## 2023-01-03 ENCOUNTER — Ambulatory Visit: Payer: 59 | Admitting: Cardiology

## 2023-02-25 ENCOUNTER — Ambulatory Visit: Payer: 59 | Admitting: Cardiology

## 2023-03-20 ENCOUNTER — Emergency Department (HOSPITAL_COMMUNITY)
Admission: EM | Admit: 2023-03-20 | Discharge: 2023-03-20 | Disposition: A | Discharge status: Home / Self Care | Payer: Self-pay | Attending: Emergency Medicine | Admitting: Emergency Medicine

## 2023-03-20 ENCOUNTER — Other Ambulatory Visit: Payer: Self-pay

## 2023-03-20 ENCOUNTER — Emergency Department (HOSPITAL_COMMUNITY): Payer: Self-pay

## 2023-03-20 DIAGNOSIS — M5412 Radiculopathy, cervical region: Secondary | ICD-10-CM | POA: Insufficient documentation

## 2023-03-20 DIAGNOSIS — J449 Chronic obstructive pulmonary disease, unspecified: Secondary | ICD-10-CM | POA: Insufficient documentation

## 2023-03-20 LAB — I-STAT CHEM 8, ED
BUN: 13 mg/dL (ref 6–20)
Calcium, Ion: 1.18 mmol/L (ref 1.15–1.40)
Chloride: 100 mmol/L (ref 98–111)
Creatinine, Ser: 1.1 mg/dL (ref 0.61–1.24)
Glucose, Bld: 153 mg/dL — ABNORMAL HIGH (ref 70–99)
HCT: 41 % (ref 39.0–52.0)
Hemoglobin: 13.9 g/dL (ref 13.0–17.0)
Potassium: 3.8 mmol/L (ref 3.5–5.1)
Sodium: 142 mmol/L (ref 135–145)
TCO2: 27 mmol/L (ref 22–32)
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
## 2023-03-20 NOTE — Discharge Instructions (Addendum)
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  It also could be coming from your shoulder.  You will need to follow-up with the specialist for this.  Blood work looked good today.

## 2023-11-01 ENCOUNTER — Encounter: Payer: Self-pay | Admitting: Family Medicine

## 2023-11-01 ENCOUNTER — Ambulatory Visit: Payer: Self-pay | Admitting: Family Medicine

## 2023-11-01 VITALS — BP 138/80 | HR 90 | Temp 98.6°F | Ht 75.0 in | Wt 253.8 lb

## 2023-11-01 DIAGNOSIS — E66811 Obesity, class 1: Secondary | ICD-10-CM | POA: Insufficient documentation

## 2023-11-01 DIAGNOSIS — E6609 Other obesity due to excess calories: Secondary | ICD-10-CM

## 2023-11-01 DIAGNOSIS — Z6831 Body mass index (BMI) 31.0-31.9, adult: Secondary | ICD-10-CM

## 2023-11-01 DIAGNOSIS — G4739 Other sleep apnea: Secondary | ICD-10-CM

## 2023-11-01 DIAGNOSIS — J301 Allergic rhinitis due to pollen: Secondary | ICD-10-CM | POA: Diagnosis not present

## 2023-11-01 DIAGNOSIS — I10 Essential (primary) hypertension: Secondary | ICD-10-CM | POA: Diagnosis not present

## 2023-11-01 DIAGNOSIS — I480 Paroxysmal atrial fibrillation: Secondary | ICD-10-CM | POA: Diagnosis not present

## 2023-11-01 DIAGNOSIS — R0683 Snoring: Secondary | ICD-10-CM

## 2023-11-01 DIAGNOSIS — E782 Mixed hyperlipidemia: Secondary | ICD-10-CM

## 2023-11-01 LAB — LIPID PANEL
Cholesterol: 227 mg/dL — ABNORMAL HIGH (ref 0–200)
HDL: 48.7 mg/dL (ref >39.00)
LDL Cholesterol: 154 mg/dL — ABNORMAL HIGH (ref 0–99)
NonHDL: 177.92
Total CHOL/HDL Ratio: 5
Triglycerides: 122 mg/dL (ref 0.0–149.0)
VLDL: 24.4 mg/dL (ref 0.0–40.0)

## 2023-11-01 LAB — CBC WITH DIFFERENTIAL/PLATELET
Basophils Absolute: 0 10*3/uL (ref 0.0–0.1)
Basophils Relative: 0.3 % (ref 0.0–3.0)
Eosinophils Absolute: 0.2 10*3/uL (ref 0.0–0.7)
Eosinophils Relative: 4.3 % (ref 0.0–5.0)
HCT: 43.8 % (ref 39.0–52.0)
Hemoglobin: 14.6 g/dL (ref 13.0–17.0)
Lymphocytes Relative: 33.7 % (ref 12.0–46.0)
Lymphs Abs: 1.9 10*3/uL (ref 0.7–4.0)
MCHC: 33.3 g/dL (ref 30.0–36.0)
MCV: 93 fl (ref 78.0–100.0)
Monocytes Absolute: 0.6 10*3/uL (ref 0.1–1.0)
Monocytes Relative: 11 % (ref 3.0–12.0)
Neutro Abs: 2.8 10*3/uL (ref 1.4–7.7)
Neutrophils Relative %: 50.7 % (ref 43.0–77.0)
Platelets: 249 10*3/uL (ref 150.0–400.0)
RBC: 4.71 Mil/uL (ref 4.22–5.81)
RDW: 13.2 % (ref 11.5–15.5)
WBC: 5.6 10*3/uL (ref 4.0–10.5)

## 2023-11-01 LAB — COMPREHENSIVE METABOLIC PANEL WITH GFR
ALT: 34 U/L (ref 0–53)
AST: 20 U/L (ref 0–37)
Albumin: 4.6 g/dL (ref 3.5–5.2)
Alkaline Phosphatase: 52 U/L (ref 39–117)
BUN: 12 mg/dL (ref 6–23)
CO2: 29 meq/L (ref 19–32)
Calcium: 9 mg/dL (ref 8.4–10.5)
Chloride: 103 meq/L (ref 96–112)
Creatinine, Ser: 1 mg/dL (ref 0.40–1.50)
GFR: 91.73 mL/min (ref >60.00)
Glucose, Bld: 87 mg/dL (ref 70–99)
Potassium: 4 meq/L (ref 3.5–5.1)
Sodium: 139 meq/L (ref 135–145)
Total Bilirubin: 0.8 mg/dL (ref 0.2–1.2)
Total Protein: 7.4 g/dL (ref 6.0–8.3)

## 2023-11-01 MED ORDER — FLUTICASONE PROPIONATE 50 MCG/ACT NA SUSP: 2.0000 | Freq: Every day | NASAL | 6 refills | Status: AC

## 2023-11-01 MED ORDER — LEVOCETIRIZINE DIHYDROCHLORIDE 2.5 MG/5ML PO SOLN: 5.0000 mg | Freq: Every evening | ORAL | 12 refills | Status: AC

## 2023-11-01 MED ORDER — METOPROLOL TARTRATE 25 MG PO TABS: 25.0000 mg | ORAL_TABLET | Freq: Two times a day (BID) | ORAL | 1 refills | Status: DC

## 2023-11-01 NOTE — Assessment & Plan Note (Signed)
Restart metoprolol 25mg twice a day

## 2023-11-01 NOTE — Progress Notes (Signed)
 New Patient Office Visit  Subjective    Patient ID: Daniel Ruiz, male    DOB: 1980/09/08  Age: 43 y.o. MRN: [id number]  CC:  Chief Complaint  Patient presents with   Establish Care    HPI Daniel Ruiz presents to establish care today. Does not currently take prescription medications. Has history of A-fib with RVR. Does not currently see cardiology. Reports daily marijuana use. Reports increased nasal congestion and postnasal drip, especially with increased pollen outside.  He works outside at [employer].  States that mucus is sometimes so thick that it chokes him in the mornings. Has tried Claritin with little relief.  Reports concern for sleep apnea.  States that his wife tells him that he snores and then gasps for breath throughout the night. Has never had sleep study. Denies other concerns today.  Outpatient Encounter Medications as of 11/01/2023  Medication Sig   fluticasone  (FLONASE ) 50 MCG/ACT nasal spray Place 2 sprays into both nostrils daily.   levocetirizine (XYZAL) 2.5 MG/5ML solution Take 10 mLs (5 mg total) by mouth every evening.   metoprolol  tartrate (LOPRESSOR ) 25 MG tablet Take 1 tablet (25 mg total) by mouth 2 (two) times daily.   [DISCONTINUED] azithromycin  (ZITHROMAX  Z-PAK) 250 MG tablet Take two tablets today, then one tablet a day for the next four days (Patient not taking: Reported on 11/01/2023)   [DISCONTINUED] diazepam  (VALIUM ) 5 MG tablet Take 1 tablet (5 mg total) by mouth every 8 (eight) hours as needed (breakthrough stiffness and pain). (Patient not taking: Reported on 11/01/2023)   [DISCONTINUED] diltiazem  (CARDIZEM  CD) 240 MG 24 hr capsule Take 1 capsule (240 mg total) by mouth daily. (Patient not taking: Reported on 11/01/2023)   [DISCONTINUED] metoprolol  tartrate (LOPRESSOR ) 25 MG tablet Take 1 tablet (25 mg total) by mouth 2 (two) times daily. (Patient not taking: Reported on 11/01/2023)   [DISCONTINUED] naproxen  (NAPROSYN ) 375 MG tablet  Take 1 tablet (375 mg total) by mouth 2 (two) times daily. (Patient not taking: Reported on 11/01/2023)   [DISCONTINUED] oxymetazoline  (AFRIN NASAL SPRAY) 0.05 % nasal spray Place 1 spray into both nostrils 2 (two) times daily. (Patient not taking: Reported on 11/01/2023)   No facility-administered encounter medications on file as of 11/01/2023.    Past Medical History:  Diagnosis Date   COPD (chronic obstructive pulmonary disease) (HCC)    GERD (gastroesophageal reflux disease)    Irregular heart beat     Past Surgical History:  Procedure Laterality Date   ORIF FINGER / THUMB FRACTURE Right     Family History  Problem Relation Age of Onset   Diabetes Mother    Hypertension Mother     Social History   Socioeconomic History   Marital status: Married    Spouse name: Not on file   Number of children: Not on file   Years of education: Not on file   Highest education level: Not on file  Occupational History   Not on file  Tobacco Use   Smoking status: Former    Current packs/day: 0.50    Average packs/day: 0.5 packs/day for 13.0 years (6.5 ttl pk-yrs)    Types: Cigarettes   Smokeless tobacco: Never   Tobacco comments:    Former smoker 04/11/2021  Vaping Use   Vaping status: Never Used  Substance and Sexual Activity   Alcohol use: Not Currently    Alcohol/week: 3.0 - 4.0 standard drinks of alcohol    Types: 3 - 4 Cans of beer per  week    Comment: just started back drinking   Drug use: Not Currently    Types: Marijuana    Comment: smoking daily   Sexual activity: Yes    Birth control/protection: None  Other Topics Concern   Not on file  Social History Narrative   Not on file   Social Drivers of Health   Financial Resource Strain: Not on file  Food Insecurity: Not on file  Transportation Needs: Not on file  Physical Activity: Not on file  Stress: Not on file  Social Connections: Not on file  Intimate Partner Violence: Not on file    ROS Per HPI       Objective    BP 138/80 (BP Location: Left Arm, Patient Position: Sitting)   Pulse 90   Temp 98.6 F (37 C) (Temporal)   Ht 6\' 3"  (1.905 m)   Wt 253 lb 12.8 oz (115.1 kg)   SpO2 98%   BMI 31.72 kg/m   Physical Exam Vitals and nursing note reviewed.  Constitutional:      General: He is not in acute distress.    Appearance: Normal appearance. He is obese.  HENT:     Head: Normocephalic and atraumatic.     Right Ear: Tympanic membrane, ear canal and external ear normal.     Left Ear: Tympanic membrane, ear canal and external ear normal.     Nose: Congestion present.     Mouth/Throat:     Mouth: Mucous membranes are moist.     Pharynx: Oropharynx is clear.     Comments: Oropharyngeal cobblestoning   Eyes:     Extraocular Movements: Extraocular movements intact.  Cardiovascular:     Rate and Rhythm: Normal rate and regular rhythm.     Pulses: Normal pulses.     Heart sounds: Normal heart sounds.  Pulmonary:     Effort: Pulmonary effort is normal. No respiratory distress.     Breath sounds: Normal breath sounds. No wheezing, rhonchi or rales.  Musculoskeletal:        General: Normal range of motion.     Cervical back: Normal range of motion.     Right lower leg: No edema.     Left lower leg: No edema.  Lymphadenopathy:     Cervical: No cervical adenopathy.  Skin:    General: Skin is warm and dry.  Neurological:     General: No focal deficit present.     Mental Status: He is alert and oriented to person, place, and time.  Psychiatric:        Mood and Affect: Mood normal.        Behavior: Behavior normal.        Assessment & Plan:   Paroxysmal atrial fibrillation (HCC) Assessment & Plan: Restart metoprolol  25 mg twice a day  Orders: -     Metoprolol  Tartrate; Take 1 tablet (25 mg total) by mouth 2 (two) times daily.  Dispense: 180 tablet; Refill: 1 -     CBC with Differential/Platelet -     Lipid panel -     Comprehensive metabolic panel with  GFR  Primary hypertension Assessment & Plan: Restart metoprolol  25 mg twice a day  Orders: -     CBC with Differential/Platelet -     Lipid panel -     Comprehensive metabolic panel with GFR  Class 1 obesity due to excess calories with serious comorbidity and body mass index (BMI) of 31.0 to 31.9 in adult Assessment &  Plan: Discussed heart healthy diet and activity level   Seasonal allergic rhinitis due to pollen Assessment & Plan: Will trial Xyzal and Flonase  together  Orders: -     Fluticasone  Propionate; Place 2 sprays into both nostrils daily.  Dispense: 16 g; Refill: 6 -     Levocetirizine Dihydrochloride; Take 10 mLs (5 mg total) by mouth every evening.  Dispense: 148 mL; Refill: 12  Snoring Assessment & Plan: Referral to sleep medicine today  Orders: -     Ambulatory referral to Neurology  Sleep apnea-like behavior Assessment & Plan: Referral to sleep medicine today  Orders: -     Ambulatory referral to Neurology     Return in about 6 months (around 05/02/2024) for medication management.   Wellington Half, FNP

## 2023-11-01 NOTE — Assessment & Plan Note (Signed)
 Referral to sleep medicine today

## 2023-11-01 NOTE — Assessment & Plan Note (Signed)
 Discussed heart healthy diet and activity level

## 2023-11-01 NOTE — Patient Instructions (Addendum)
 Welcome to [facility]!  Thank you for choosing us  for your Primary Care needs.   We offer in person and video appointments for your convenience. You may call our office to schedule appointments, or you may schedule appointments with me through MyChart.   The best way to get in contact with me is via MyChart message. This will get to me faster than a phone call, unless there is an emergency, then please call 911.  The lab is located downstairs in the Sports Medicine building, we also have xray available there.   We are checking labs today, will be in contact with any results that require further attention  I have sent in xyzal and flonase  for you to use daily to help with allergy symptoms  I have sent in metoprolol  for you to take 25mg  twice a day to help keep your heart in rhythm.  Follow-up with me in 6 mos for medication management, sooner if needed.

## 2023-11-01 NOTE — Assessment & Plan Note (Signed)
 Will trial Xyzal and Flonase  together

## 2023-11-12 ENCOUNTER — Encounter: Payer: Self-pay | Admitting: Family Medicine

## 2023-11-12 MED ORDER — ROSUVASTATIN CALCIUM 10 MG PO TABS: 10.0000 mg | ORAL_TABLET | Freq: Every day | ORAL | 1 refills | Status: DC

## 2023-11-12 NOTE — Addendum Note (Signed)
 Addended by: Wellington Half on: 11/12/2023 05:17 PM   Modules accepted: Orders

## 2023-12-12 ENCOUNTER — Ambulatory Visit: Admitting: Neurology

## 2023-12-12 ENCOUNTER — Encounter: Payer: Self-pay | Admitting: Neurology

## 2023-12-12 VITALS — BP 118/75 | HR 88 | Ht 75.0 in | Wt 251.0 lb

## 2023-12-12 DIAGNOSIS — Z9189 Other specified personal risk factors, not elsewhere classified: Secondary | ICD-10-CM

## 2023-12-12 DIAGNOSIS — E66811 Obesity, class 1: Secondary | ICD-10-CM

## 2023-12-12 DIAGNOSIS — Z820 Family history of epilepsy and other diseases of the nervous system: Secondary | ICD-10-CM

## 2023-12-12 DIAGNOSIS — I480 Paroxysmal atrial fibrillation: Secondary | ICD-10-CM

## 2023-12-12 DIAGNOSIS — R519 Headache, unspecified: Secondary | ICD-10-CM

## 2023-12-12 DIAGNOSIS — G4719 Other hypersomnia: Secondary | ICD-10-CM | POA: Diagnosis not present

## 2023-12-12 DIAGNOSIS — R0681 Apnea, not elsewhere classified: Secondary | ICD-10-CM

## 2023-12-12 DIAGNOSIS — R0683 Snoring: Secondary | ICD-10-CM | POA: Diagnosis not present

## 2023-12-12 NOTE — Progress Notes (Signed)
 Subjective:    Patient ID: Daniel Ruiz is a 43 y.o. male.  HPI    Daniel Fairy, MD, PhD [facility] [address], [address] [address] [place], [place] [address]  Dear Daniel Ruiz,  I saw your patient, Daniel Ruiz, upon your kind request in my sleep clinic today for initial consultation of his sleep disorder, in particular, concern for underlying obstructive sleep apnea.  The patient is accompanied by his wife today.  As you know, Daniel Ruiz is a 43 year old male with an underlying medical history of hypertension, allergic rhinitis, paroxysmal A-fib, hyperlipidemia, and mild obesity, who reports snoring and excessive daytime somnolence as well as witnessed apneas per wife's observation.  He makes gasping sounds while asleep.  His Epworth sleepiness score is 11 out of 24, fatigue severity score is 30 out of 63.  I reviewed your office note from 11/01/2023.  He has never had a sleep study.  He was diagnosed with A-fib a couple of years ago, maybe 2022.  He has not seen a cardiologist yet but is supposed to see a cardiologist.  He does not wake up rested.  Bedtime is variable, generally between 9 and midnight and rise time around 6:30 AM but he is often awake before then.  He has a family history of sleep apnea affecting his paternal aunts and 1 paternal uncle.  Father died young at 7.  He lives with his wife, he has 9 children altogether, most of them are grown, the 3 youngest children do not live with him, youngest is 39.  He has nasal congestion, he quit smoking cigarettes in 2021 but still smokes marijuana, is trying to quit.  He has occasional morning headaches.  He has no nightly nocturia.  He drinks alcohol in the form of beer, 1 or 2/day, occasional liquor.  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.  His Past Medical History Is Significant For: Past Medical History:  Diagnosis Date   A-fib (HCC)    COPD (chronic obstructive pulmonary disease) (HCC)    GERD  (gastroesophageal reflux disease)    Irregular heart beat     His Past Surgical History Is Significant For: Past Surgical History:  Procedure Laterality Date   ORIF FINGER / THUMB FRACTURE Right     His Family History Is Significant For: Family History  Problem Relation Age of Onset   Diabetes Mother    Hypertension Mother    Sleep apnea Paternal Aunt    Sleep apnea Paternal Uncle     Her Social History Is Significant For: Social History   Socioeconomic History   Marital status: Married    Spouse name: Not on file   Number of children: Not on file   Years of education: Not on file   Highest education level: Not on file  Occupational History   Not on file  Tobacco Use   Smoking status: Former    Current packs/day: 0.50    Average packs/day: 0.5 packs/day for 13.0 years (6.5 ttl pk-yrs)    Types: Cigarettes   Smokeless tobacco: Never   Tobacco comments:    Former smoker 04/11/2021  Vaping Use   Vaping status: Never Used  Substance and Sexual Activity   Alcohol use: Yes    Alcohol/week: 14.0 standard drinks of alcohol    Types: 7 Cans of beer, 7 Shots of liquor per week    Comment: just started back drinking   Drug use: Yes    Types: Marijuana  Comment: smoking daily   Sexual activity: Yes    Birth control/protection: None  Other Topics Concern   Not on file  Social History Narrative   Pt lives with wife    Pt works    Social Drivers of Corporate investment banker Strain: Not on file  Food Insecurity: Not on file  Transportation Needs: Not on file  Physical Activity: Not on file  Stress: Not on file  Social Connections: Not on file    His Allergies Are:  No Known Allergies:   His Current Medications Are:  Outpatient Encounter Medications as of 12/12/2023  Medication Sig   fluticasone  (FLONASE ) 50 MCG/ACT nasal spray Place 2 sprays into both nostrils daily.   metoprolol  tartrate (LOPRESSOR ) 25 MG tablet Take 1 tablet (25 mg total) by mouth 2 (two)  times daily.   rosuvastatin  (CRESTOR ) 10 MG tablet Take 1 tablet (10 mg total) by mouth daily.   levocetirizine (XYZAL ) 2.5 MG/5ML solution Take 10 mLs (5 mg total) by mouth every evening.   No facility-administered encounter medications on file as of 12/12/2023.  :   Review of Systems:  Out of a complete 14 point review of systems, all are reviewed and negative with the exception of these symptoms as listed below:   Review of Systems  Neurological:        Pt here for sleep consult Pt snores,headaches,fatigue Pt denies sleep study,cpap machine,hypertension  Pt states just diagnosed with COPD &AFIB    ESS:11 FSS:30    Objective:  Neurological Exam  Physical Exam Physical Examination:   Vitals:   12/12/23 1530  BP: 118/75  Pulse: 88    General Examination: The patient is a very pleasant 43 y.o. male in no acute distress. He appears well-developed and well-nourished and well groomed.   HEENT: Normocephalic, atraumatic, pupils are equal, round and reactive to light, extraocular tracking is good without limitation to gaze excursion or nystagmus noted. Hearing is grossly intact. Face is symmetric with normal facial animation. Speech is clear with no dysarthria noted. There is no hypophonia. There is no lip, neck/head, jaw or voice tremor. Neck is supple with full range of passive and active motion. There are no carotid bruits on auscultation. Oropharynx exam reveals: No significant mouth dryness, good dental hygiene, marked airway crowding secondary to Mallampati class IV, tonsillar size about 3+ bilaterally, thicker soft palate and wider tongue noted.  Uvula mildly swollen.  Tongue protrudes centrally and palate elevates symmetrically.  Nasal inspection revealed significant inferior turbinate hypertrophy and nasal mucosal bogginess bilaterally.   Chest: Clear to auscultation without wheezing, rhonchi or crackles noted.  Heart: S1+S2+0, regular and normal without murmurs, rubs or  gallops noted.   Abdomen: Soft, non-tender and non-distended.  Extremities: There is no pitting edema in the distal lower extremities bilaterally.   Skin: Warm and dry without trophic changes noted.   Musculoskeletal: exam reveals no obvious joint deformities.   Neurologically:  Mental status: The patient is awake, alert and oriented in all 4 spheres. His immediate and remote memory, attention, language skills and fund of knowledge are appropriate. There is no evidence of aphasia, agnosia, apraxia or anomia. Speech is clear with normal prosody and enunciation. Thought process is linear. Mood is normal and affect is normal.  Cranial nerves II - XII are as described above under HEENT exam.  Motor exam: Normal bulk, strength and tone is noted. There is no obvious action or resting tremor.  Fine motor skills and coordination: grossly  intact.  Cerebellar testing: No dysmetria or intention tremor. There is no truncal or gait ataxia.  Sensory exam: intact to light touch in the upper and lower extremities.  Gait, station and balance: He stands easily. No veering to one side is noted. No leaning to one side is noted. Posture is age-appropriate and stance is narrow based. Gait shows normal stride length and normal pace. No problems turning are noted.   Assessment and Plan:   In summary, Corgan Mormile is a very pleasant 43 y.o.-year old male with an underlying medical history of hypertension, allergic rhinitis, paroxysmal A-fib, hyperlipidemia, and mild obesity, whose history and physical exam are concerning for sleep disordered breathing, particularly obstructive sleep apnea (OSA).  While a laboratory attended sleep study is typically considered "gold standard" for evaluation of sleep disordered breathing, we mutually agreed to proceed with a home sleep test at this time given his erratic sleep schedule and also with his pretest probability for obstructive sleep apnea being rather high.   I had a long  chat with the patient and his wife about my findings and the diagnosis of sleep apnea, particularly OSA, its prognosis and treatment options. We talked about medical/conservative treatments, surgical interventions and non-pharmacological approaches for symptom control. I explained, in particular, the risks and ramifications of untreated moderate to severe OSA, especially with respect to developing cardiovascular disease down the road, including congestive heart failure (CHF), difficult to treat hypertension, cardiac arrhythmias (particularly A-fib), neurovascular complications including TIA, stroke and dementia. Even type 2 diabetes has, in part, been linked to untreated OSA. Symptoms of untreated OSA may include (but may not be limited to) daytime sleepiness, nocturia (i.e. frequent nighttime urination), memory problems, mood irritability and suboptimally controlled or worsening mood disorder such as depression and/or anxiety, lack of energy, lack of motivation, physical discomfort, as well as recurrent headaches, especially morning or nocturnal headaches. We talked about the importance of maintaining a healthy lifestyle and striving for healthy weight.  The importance of complete cigarette and THC smoking cessation was also addressed.  In addition, we talked about the importance of striving for and maintaining good sleep hygiene. I recommended a sleep study at this time. I outlined the differences between a laboratory attended sleep study which is considered more comprehensive and accurate over the option of a home sleep test (HST); the latter may lead to underestimation of sleep disordered breathing in some instances and does not help with diagnosing upper airway resistance syndrome and is not accurate enough to diagnose primary central sleep apnea typically. I outlined possible surgical and non-surgical treatment options of OSA, including the use of a positive airway pressure (PAP) device (i.e. CPAP,  AutoPAP/APAP or BiPAP in certain circumstances), a custom-made dental device (aka oral appliance, which would require a referral to a specialist dentist or orthodontist typically, and is generally speaking not considered for patients with full dentures or edentulous state), upper airway surgical options, such as traditional UPPP (which is not considered a first-line treatment) or the Inspire device (hypoglossal nerve stimulator, which would involve a referral for consultation with an ENT surgeon, after careful selection, following inclusion criteria - also not first-line treatment). I explained the PAP treatment option to the patient in detail, as this is generally considered first-line treatment.  The patient indicated that he would be willing to try PAP therapy, if the need arises. I explained the importance of being compliant with PAP treatment, not only for insurance purposes but primarily to improve patient's symptoms symptoms, and  for the patient's long term health benefit, including to reduce His cardiovascular risks longer-term.    We will pick up our discussion about the next steps and treatment options after testing.  We will keep them posted as to the test results by phone call and/or MyChart messaging where possible.  We will plan to follow-up in sleep clinic accordingly as well.  I answered all their questions today and the patient and his wife were in agreement.   I encouraged them to call with any interim questions, concerns, problems or updates or email us  through MyChart.  Generally speaking, sleep test authorizations may take up to 2 weeks, sometimes less, sometimes longer, the patient is encouraged to get in touch with us  if they do not hear back from the sleep lab staff directly within the next 2 weeks.  Thank you very much for allowing me to participate in the care of this nice patient. If I can be of any further assistance to you please do not hesitate to call me at  [phone].  Sincerely,   Daniel Fairy, MD, PhD

## 2023-12-12 NOTE — Patient Instructions (Signed)

## 2024-01-15 ENCOUNTER — Encounter (HOSPITAL_COMMUNITY): Payer: Self-pay

## 2024-01-15 ENCOUNTER — Ambulatory Visit (INDEPENDENT_AMBULATORY_CARE_PROVIDER_SITE_OTHER)

## 2024-01-15 ENCOUNTER — Ambulatory Visit (HOSPITAL_COMMUNITY)
Admission: EM | Admit: 2024-01-15 | Discharge: 2024-01-15 | Disposition: A | Discharge status: Home / Self Care | Attending: Internal Medicine | Admitting: Internal Medicine

## 2024-01-15 DIAGNOSIS — Z87891 Personal history of nicotine dependence: Secondary | ICD-10-CM

## 2024-01-15 DIAGNOSIS — J209 Acute bronchitis, unspecified: Secondary | ICD-10-CM

## 2024-01-15 DIAGNOSIS — J40 Bronchitis, not specified as acute or chronic: Secondary | ICD-10-CM | POA: Diagnosis not present

## 2024-01-15 DIAGNOSIS — R0602 Shortness of breath: Secondary | ICD-10-CM

## 2024-01-15 DIAGNOSIS — J410 Simple chronic bronchitis: Secondary | ICD-10-CM | POA: Diagnosis not present

## 2024-01-15 DIAGNOSIS — R059 Cough, unspecified: Secondary | ICD-10-CM | POA: Diagnosis not present

## 2024-01-15 DIAGNOSIS — R062 Wheezing: Secondary | ICD-10-CM | POA: Diagnosis not present

## 2024-01-15 LAB — POC SARS CORONAVIRUS 2 AG -  ED: SARS Coronavirus 2 Ag: NEGATIVE

## 2024-01-15 MED ORDER — ALBUTEROL SULFATE HFA 108 (90 BASE) MCG/ACT IN AERS: 1.0000 | INHALATION_SPRAY | Freq: Four times a day (QID) | RESPIRATORY_TRACT | 0 refills | Status: AC | PRN

## 2024-01-15 MED ORDER — ALBUTEROL SULFATE (2.5 MG/3ML) 0.083% IN NEBU
2.5000 mg | INHALATION_SOLUTION | Freq: Once | RESPIRATORY_TRACT | Status: AC
  Administered 2024-01-15: 2.5 mg via RESPIRATORY_TRACT

## 2024-01-15 MED ORDER — PREDNISONE 20 MG PO TABS: 40.0000 mg | ORAL_TABLET | Freq: Every day | ORAL | 0 refills | Status: AC

## 2024-01-15 MED ORDER — GUAIFENESIN ER 1200 MG PO TB12: 1200.0000 mg | ORAL_TABLET | Freq: Two times a day (BID) | ORAL | 0 refills | Status: AC

## 2024-01-15 MED ORDER — METHYLPREDNISOLONE SODIUM SUCC 125 MG IJ SOLR
80.0000 mg | Freq: Once | INTRAMUSCULAR | Status: AC
  Administered 2024-01-15: 80 mg via INTRAMUSCULAR

## 2024-01-15 MED ORDER — IPRATROPIUM-ALBUTEROL 0.5-2.5 (3) MG/3ML IN SOLN
RESPIRATORY_TRACT | Status: AC
  Filled 2024-01-15: qty 3

## 2024-01-15 MED ORDER — ALBUTEROL SULFATE (2.5 MG/3ML) 0.083% IN NEBU
INHALATION_SOLUTION | RESPIRATORY_TRACT | Status: AC
  Filled 2024-01-15: qty 3

## 2024-01-15 MED ORDER — PROMETHAZINE-DM 6.25-15 MG/5ML PO SYRP: 5.0000 mL | ORAL_SOLUTION | Freq: Every evening | ORAL | 0 refills | Status: AC | PRN

## 2024-01-15 MED ORDER — ALBUTEROL SULFATE (2.5 MG/3ML) 0.083% IN NEBU: 2.5000 mg | INHALATION_SOLUTION | Freq: Four times a day (QID) | RESPIRATORY_TRACT | 12 refills | Status: AC | PRN

## 2024-01-15 MED ORDER — METHYLPREDNISOLONE SODIUM SUCC 125 MG IJ SOLR
INTRAMUSCULAR | Status: AC
  Filled 2024-01-15: qty 2

## 2024-01-15 MED ORDER — IPRATROPIUM-ALBUTEROL 0.5-2.5 (3) MG/3ML IN SOLN
3.0000 mL | Freq: Once | RESPIRATORY_TRACT | Status: AC
  Administered 2024-01-15: 3 mL via RESPIRATORY_TRACT

## 2024-01-15 NOTE — ED Provider Notes (Signed)
 [facility]    CSN: [id number] Arrival date & time: 01/15/24  9096      History   Chief Complaint No chief complaint on file.   HPI Daniel Ruiz is a 43 y.o. male.   Daniel Ruiz is a 43 y.o. male presenting for chief complaint of cough, congestion, sore throat, and generalized fatigue that started approximately 3 days ago.  His wife is sick with similar symptoms. Cough is mostly dry nonproductive, however sometimes productive with yellow sputum.  Reports shortness of breath associated with coughing.  He is a former smoker, history of COPD, he has had an inhaler in the past but does not currently have access to an albuterol  inhaler.  Denies recent antibiotic or steroid use, nausea, vomiting, diarrhea, abdominal pain, rash, and leg swelling/orthopnea.  He has not had a fever or chills.  History of atrial fibrillation, he does not currently take a blood thinner.  Taking over-the-counter medications with minimal relief.      Past Medical History:  Diagnosis Date   A-fib (HCC)    COPD (chronic obstructive pulmonary disease) (HCC)    GERD (gastroesophageal reflux disease)    Irregular heart beat     Patient Active Problem List   Diagnosis Date Noted   Primary hypertension 11/01/2023   Class 1 obesity due to excess calories with serious comorbidity and body mass index (BMI) of 31.0 to 31.9 in adult 11/01/2023   Seasonal allergic rhinitis due to pollen 11/01/2023   Snoring 11/01/2023   Sleep apnea-like behavior 11/01/2023   Paroxysmal atrial fibrillation (HCC) 04/11/2021   Atrial fibrillation with rapid ventricular response (HCC) 03/14/2021    Past Surgical History:  Procedure Laterality Date   ORIF FINGER / THUMB FRACTURE Right        Home Medications    Prior to Admission medications   Medication Sig Start Date End Date Taking? Authorizing Provider  albuterol  (PROVENTIL ) (2.5 MG/3ML) 0.083% nebulizer solution Take 3 mLs (2.5 mg total) by nebulization  every 6 (six) hours as needed for wheezing or shortness of breath. 01/15/24  Yes Enedelia Dorna HERO, FNP  albuterol  (VENTOLIN  HFA) 108 (90 Base) MCG/ACT inhaler Inhale 1-2 puffs into the lungs every 6 (six) hours as needed for wheezing or shortness of breath. 01/15/24  Yes Enedelia Dorna HERO, FNP  fluticasone  (FLONASE ) 50 MCG/ACT nasal spray Place 2 sprays into both nostrils daily. 11/01/23  Yes Alvia Corean CROME, FNP  Guaifenesin  1200 MG TB12 Take 1 tablet (1,200 mg total) by mouth in the morning and at bedtime. 01/15/24  Yes Enedelia Dorna HERO, FNP  metoprolol  tartrate (LOPRESSOR ) 25 MG tablet Take 1 tablet (25 mg total) by mouth 2 (two) times daily. 11/01/23  Yes Alvia Corean CROME, FNP  predniSONE  (DELTASONE ) 20 MG tablet Take 2 tablets (40 mg total) by mouth daily with breakfast for 5 days. 01/15/24 01/20/24 Yes StanhopeDorna HERO, FNP  promethazine -dextromethorphan  (PROMETHAZINE -DM) 6.25-15 MG/5ML syrup Take 5 mLs by mouth at bedtime as needed for cough. 01/15/24  Yes Enedelia Dorna HERO, FNP  rosuvastatin  (CRESTOR ) 10 MG tablet Take 1 tablet (10 mg total) by mouth daily. 11/12/23  Yes Alvia Corean CROME, FNP  levocetirizine (XYZAL ) 2.5 MG/5ML solution Take 10 mLs (5 mg total) by mouth every evening. 11/01/23 12/01/23  Alvia Corean CROME, FNP    Family History Family History  Problem Relation Age of Onset   Diabetes Mother    Hypertension Mother    Sleep apnea Paternal Aunt    Sleep apnea Paternal  Uncle     Social History Social History   Tobacco Use   Smoking status: Former    Current packs/day: 0.50    Average packs/day: 0.5 packs/day for 13.0 years (6.5 ttl pk-yrs)    Types: Cigarettes   Smokeless tobacco: Never   Tobacco comments:    Former smoker 04/11/2021  Vaping Use   Vaping status: Never Used  Substance Use Topics   Alcohol use: Yes    Alcohol/week: 14.0 standard drinks of alcohol    Types: 7 Cans of beer, 7 Shots of liquor per week    Comment: just  started back drinking   Drug use: Yes    Types: Marijuana    Comment: smoking daily     Allergies   Patient has no known allergies.   Review of Systems Review of Systems Per HPI  Physical Exam Triage Vital Signs ED Triage Vitals [01/15/24 0936]  Encounter Vitals Group     BP (!) 155/96     Girls Systolic BP Percentile      Girls Diastolic BP Percentile      Boys Systolic BP Percentile      Boys Diastolic BP Percentile      Pulse Rate 88     Resp (!) 22     Temp 98.1 F (36.7 C)     Temp Source Oral     SpO2 96 %     Weight      Height      Head Circumference      Peak Flow      Pain Score      Pain Loc      Pain Education      Exclude from Growth Chart    No data found.  Updated Vital Signs BP (!) 155/96 (BP Location: Left Arm)   Pulse 88   Temp 98.1 F (36.7 C) (Oral)   Resp (!) 22   SpO2 96%   Visual Acuity Right Eye Distance:   Left Eye Distance:   Bilateral Distance:    Right Eye Near:   Left Eye Near:    Bilateral Near:     Physical Exam Vitals and nursing note reviewed.  Constitutional:      Appearance: He is not ill-appearing or toxic-appearing.  HENT:     Head: Normocephalic and atraumatic.     Right Ear: Hearing, tympanic membrane, ear canal and external ear normal.     Left Ear: Hearing, tympanic membrane, ear canal and external ear normal.     Nose: Nose normal.     Mouth/Throat:     Lips: Pink.     Mouth: Mucous membranes are moist. No injury or oral lesions.     Dentition: Normal dentition.     Tongue: No lesions.     Pharynx: Oropharynx is clear. Uvula midline. No pharyngeal swelling, oropharyngeal exudate, posterior oropharyngeal erythema, uvula swelling or postnasal drip.     Tonsils: No tonsillar exudate.  Eyes:     General: Lids are normal. Vision grossly intact. Gaze aligned appropriately.     Extraocular Movements: Extraocular movements intact.     Conjunctiva/sclera: Conjunctivae normal.  Neck:     Trachea: Trachea  and phonation normal.  Cardiovascular:     Rate and Rhythm: Normal rate and regular rhythm.     Heart sounds: Normal heart sounds, S1 normal and S2 normal.  Pulmonary:     Effort: Pulmonary effort is normal. No respiratory distress.     Breath sounds: Normal  air entry. Wheezing (Inspiratory and expiratory wheezing to the diffuse lung fields) present. No rhonchi or rales.     Comments: Speaking in full sentences without difficulty.  Wheezing heard without stethoscope from across the room when coughing. Chest:     Chest wall: No tenderness.  Musculoskeletal:     Cervical back: Neck supple.  Lymphadenopathy:     Cervical: No cervical adenopathy.  Skin:    General: Skin is warm and dry.     Capillary Refill: Capillary refill takes less than 2 seconds.     Findings: No rash.  Neurological:     General: No focal deficit present.     Mental Status: He is alert and oriented to person, place, and time. Mental status is at baseline.     Cranial Nerves: No dysarthria or facial asymmetry.  Psychiatric:        Mood and Affect: Mood normal.        Speech: Speech normal.        Behavior: Behavior normal.        Thought Content: Thought content normal.        Judgment: Judgment normal.      UC Treatments / Results  Labs (all labs ordered are listed, but only abnormal results are displayed) Labs Reviewed  POC SARS CORONAVIRUS 2 AG -  ED    EKG   Radiology DG Chest 2 View Result Date: 01/15/2024 CLINICAL DATA:  Cough, wheezing, and shortness of breath for 3 days. EXAM: CHEST - 2 VIEW COMPARISON:  09/13/2022 FINDINGS: The heart size and mediastinal contours are within normal limits. Stable mild scarring in right midlung. The lungs are otherwise clear. The visualized skeletal structures are unremarkable. IMPRESSION: No active cardiopulmonary disease. Electronically Signed   By: Norleen DELENA Kil M.D.   On: 01/15/2024 10:30    Procedures Procedures (including critical care time)  Medications  Ordered in UC Medications  ipratropium-albuterol  (DUONEB) 0.5-2.5 (3) MG/3ML nebulizer solution 3 mL (3 mLs Nebulization Given 01/15/24 0958)  methylPREDNISolone  sodium succinate (SOLU-MEDROL ) 125 mg/2 mL injection 80 mg (80 mg Intramuscular Given 01/15/24 0958)  albuterol  (PROVENTIL ) (2.5 MG/3ML) 0.083% nebulizer solution 2.5 mg (2.5 mg Nebulization Given 01/15/24 1104)    Initial Impression / Assessment and Plan / UC Course  I have reviewed the triage vital signs and the nursing notes.  Pertinent labs & imaging results that were available during my care of the patient were reviewed by me and considered in my medical decision making (see chart for details).   1. Acute bronchitis, shortness of breath, former smoker Evaluation suggests viral bronchitis, however chest x-ray ordered given clinical concern for underlying focal consolidation/pneumonia.  Chest x-ray shows normal findings without acute cardiopulmonary abnormality.  Interventions in clinic: DuoNeb and albuterol  breathing treatments and Solu-Medrol  80 mg IM administered with significant improvement in lung sounds on reassessment.   Strep/viral testing: Point-of-care COVID testing is negative.  Recommend treatment with steroid, bronchodilator, cough suppressants for symptomatic relief, and expectorants (mucinex ) as needed- see AVS.    Home nebulizer machine provided in clinic to be used every 4-6 hours as needed for shortness of breath and wheezing.  Counseled patient on potential for adverse effects with medications prescribed/recommended today, strict ER and return-to-clinic precautions discussed, patient verbalized understanding.    Final Clinical Impressions(s) / UC Diagnoses   Final diagnoses:  Shortness of breath  Acute bronchitis, unspecified organism  Former smoker     Discharge Instructions      You have bronchitis which  is inflammation of the upper airways in your lungs due to a virus.   We will treat this with  steroids. We gave you a shot of steroid in the clinic today. Take prednisone  2 pills (40mg ) once daily each morning for the next 5 days starting  tomorrow morning.  Do not take any NSAIDs with steroid pills (no ibuprofen , naproxen  while taking steroid, this could cause stomach upset).   Use albuterol  breathing treatment every 4 hours for the next 24 hours on a schedule while the steroid kicks in, then use every 4-6 hours as needed for shortness of breath and wheezing.   Use albuterol  inhaler every 4-6 hours as needed for cough, shortness of breath, and wheezing.   Use guaifenesin  (plain mucinex ) to break up congestion in nose/chest so that you are able to excrete easier. Drink plenty of fluids to stay well hydrated while taking mucinex  so that it works well in the body.   Promethazine  DM cough syrup at bedtime as needed for coughing. Take this cough syrup at bedtime as this can make you sleepy.   If you develop any new or worsening symptoms or if your symptoms do not start to improve, please return here or follow-up with your primary care provider. If your symptoms are severe, please go to the emergency room.     ED Prescriptions     Medication Sig Dispense Auth. Provider   predniSONE  (DELTASONE ) 20 MG tablet Take 2 tablets (40 mg total) by mouth daily with breakfast for 5 days. 10 tablet Enedelia Dorna HERO, FNP   albuterol  (PROVENTIL ) (2.5 MG/3ML) 0.083% nebulizer solution Take 3 mLs (2.5 mg total) by nebulization every 6 (six) hours as needed for wheezing or shortness of breath. 75 mL Enedelia Dorna M, FNP   albuterol  (VENTOLIN  HFA) 108 (90 Base) MCG/ACT inhaler Inhale 1-2 puffs into the lungs every 6 (six) hours as needed for wheezing or shortness of breath. 18 g Enedelia Dorna HERO, FNP   promethazine -dextromethorphan  (PROMETHAZINE -DM) 6.25-15 MG/5ML syrup Take 5 mLs by mouth at bedtime as needed for cough. 118 mL Enedelia Dorna M, FNP   Guaifenesin  1200 MG TB12 Take 1  tablet (1,200 mg total) by mouth in the morning and at bedtime. 14 tablet Enedelia Dorna HERO, FNP      PDMP not reviewed this encounter.   Enedelia Dorna HERO, [place] 01/15/24 1328

## 2024-01-15 NOTE — ED Triage Notes (Signed)
 Patient presents to the office for wheezing,cough and SOB x 3 days. Patient states he is using Flonase  and drinking OJ.

## 2024-01-15 NOTE — Discharge Instructions (Signed)
 You have bronchitis which is inflammation of the upper airways in your lungs due to a virus.   We will treat this with steroids. We gave you a shot of steroid in the clinic today. Take prednisone  2 pills (40mg ) once daily each morning for the next 5 days starting  tomorrow morning.  Do not take any NSAIDs with steroid pills (no ibuprofen , naproxen  while taking steroid, this could cause stomach upset).   Use albuterol  breathing treatment every 4 hours for the next 24 hours on a schedule while the steroid kicks in, then use every 4-6 hours as needed for shortness of breath and wheezing.   Use albuterol  inhaler every 4-6 hours as needed for cough, shortness of breath, and wheezing.   Use guaifenesin  (plain mucinex ) to break up congestion in nose/chest so that you are able to excrete easier. Drink plenty of fluids to stay well hydrated while taking mucinex  so that it works well in the body.   Promethazine  DM cough syrup at bedtime as needed for coughing. Take this cough syrup at bedtime as this can make you sleepy.   If you develop any new or worsening symptoms or if your symptoms do not start to improve, please return here or follow-up with your primary care provider. If your symptoms are severe, please go to the emergency room.

## 2024-05-04 ENCOUNTER — Ambulatory Visit: Admitting: Family Medicine

## 2024-05-04 NOTE — Progress Notes (Deleted)
   Established Patient Office Visit  Subjective:     Patient ID: Daniel Ruiz, male    DOB: August 19, 1980, 43 y.o.   MRN: [id number]  No chief complaint on file.   HPI  Discussed the use of AI scribe software for clinical note transcription with the patient, who gave verbal consent to proceed.  History of Present Illness      ROS Per HPI      Objective:    There were no vitals taken for this visit.   Physical Exam Vitals and nursing note reviewed.  Constitutional:      General: He is not in acute distress.    Appearance: Normal appearance.  HENT:     Head: Normocephalic and atraumatic.     Right Ear: External ear normal.     Left Ear: External ear normal.     Nose: Nose normal.     Mouth/Throat:     Mouth: Mucous membranes are moist.     Pharynx: Oropharynx is clear.  Eyes:     Extraocular Movements: Extraocular movements intact.  Cardiovascular:     Rate and Rhythm: Normal rate and regular rhythm.     Pulses: Normal pulses.     Heart sounds: Normal heart sounds.  Pulmonary:     Effort: Pulmonary effort is normal. No respiratory distress.     Breath sounds: Normal breath sounds. No wheezing, rhonchi or rales.  Musculoskeletal:        General: Normal range of motion.     Cervical back: Normal range of motion.     Right lower leg: No edema.     Left lower leg: No edema.  Lymphadenopathy:     Cervical: No cervical adenopathy.  Skin:    General: Skin is warm and dry.  Neurological:     General: No focal deficit present.     Mental Status: He is alert and oriented to person, place, and time.  Psychiatric:        Mood and Affect: Mood normal.        Behavior: Behavior normal.     No results found for any visits on 05/04/24.  The 10-year ASCVD risk score (Arnett DK, et al., 2019) is: 9.6%  {Vitals History (Optional):23777}  {Show previous labs (optional):23779}     Assessment & Plan:   Assessment and Plan Assessment & Plan      No orders  of the defined types were placed in this encounter.    No orders of the defined types were placed in this encounter.   No follow-ups on file.  Corean LITTIE Ku, FNP

## 2024-05-09 ENCOUNTER — Other Ambulatory Visit: Payer: Self-pay | Admitting: Family Medicine

## 2024-05-09 DIAGNOSIS — E782 Mixed hyperlipidemia: Secondary | ICD-10-CM

## 2024-05-10 ENCOUNTER — Other Ambulatory Visit: Payer: Self-pay | Admitting: Family Medicine

## 2024-05-10 DIAGNOSIS — I480 Paroxysmal atrial fibrillation: Secondary | ICD-10-CM

## 2024-06-11 ENCOUNTER — Emergency Department (HOSPITAL_COMMUNITY)

## 2024-06-11 ENCOUNTER — Emergency Department (HOSPITAL_COMMUNITY)
Admission: EM | Admit: 2024-06-11 | Discharge: 2024-06-11 | Discharge status: Against Medical Advice | Attending: Emergency Medicine | Admitting: Emergency Medicine

## 2024-06-11 ENCOUNTER — Other Ambulatory Visit: Payer: Self-pay

## 2024-06-11 DIAGNOSIS — R112 Nausea with vomiting, unspecified: Secondary | ICD-10-CM | POA: Insufficient documentation

## 2024-06-11 DIAGNOSIS — Z5321 Procedure and treatment not carried out due to patient leaving prior to being seen by health care provider: Secondary | ICD-10-CM | POA: Diagnosis not present

## 2024-06-11 DIAGNOSIS — R0789 Other chest pain: Secondary | ICD-10-CM | POA: Diagnosis not present

## 2024-06-11 DIAGNOSIS — R1110 Vomiting, unspecified: Secondary | ICD-10-CM | POA: Diagnosis not present

## 2024-06-11 LAB — CBC
HCT: 41.8 % (ref 39.0–52.0)
Hemoglobin: 13.9 g/dL (ref 13.0–17.0)
MCH: 29.4 pg (ref 26.0–34.0)
MCHC: 33.3 g/dL (ref 30.0–36.0)
MCV: 88.6 fL (ref 80.0–100.0)
Platelets: 239 K/uL (ref 150–400)
RBC: 4.72 MIL/uL (ref 4.22–5.81)
RDW: 12 % (ref 11.5–15.5)
WBC: 4.8 K/uL (ref 4.0–10.5)
nRBC: 0 % (ref 0.0–0.2)

## 2024-06-11 LAB — BASIC METABOLIC PANEL WITH GFR
Anion gap: 11 (ref 5–15)
BUN: 11 mg/dL (ref 6–20)
CO2: 23 mmol/L (ref 22–32)
Calcium: 8.7 mg/dL — ABNORMAL LOW (ref 8.9–10.3)
Chloride: 103 mmol/L (ref 98–111)
Creatinine, Ser: 1.03 mg/dL (ref 0.61–1.24)
GFR, Estimated: >60 mL/min (ref >60)
Glucose, Bld: 131 mg/dL — ABNORMAL HIGH (ref 70–99)
Potassium: 3.7 mmol/L (ref 3.5–5.1)
Sodium: 137 mmol/L (ref 135–145)

## 2024-06-11 LAB — TROPONIN I (HIGH SENSITIVITY)
Troponin I (High Sensitivity): <2 ng/L (ref <18)
Troponin I (High Sensitivity): <2 ng/L (ref <18)

## 2024-06-11 NOTE — ED Notes (Signed)
 Pt seen walking out with woman after complaining about how long he's been waiting.

## 2024-06-11 NOTE — ED Triage Notes (Addendum)
 Pt. Stated, I got up to go to work and as I was walking out the door I started to have some chest tightness , it stopped and then I threw up. Since then [facility] had some chest tightness. I only had the N/V one time.
